# Patient Record
Sex: Female | Born: 1975
Health system: Southern US, Community
[De-identification: ages and names within clinical notes are randomized; demographics above are authoritative.]

## PROBLEM LIST (undated history)

## (undated) ENCOUNTER — Ambulatory Visit: Admission: EM | Payer: 59 | Source: Home / Self Care

## (undated) DIAGNOSIS — G4763 Sleep related bruxism: Secondary | ICD-10-CM

## (undated) DIAGNOSIS — Z9889 Other specified postprocedural states: Secondary | ICD-10-CM

## (undated) DIAGNOSIS — G8929 Other chronic pain: Secondary | ICD-10-CM

## (undated) DIAGNOSIS — R202 Paresthesia of skin: Secondary | ICD-10-CM

## (undated) DIAGNOSIS — R519 Headache, unspecified: Secondary | ICD-10-CM

## (undated) DIAGNOSIS — Z808 Family history of malignant neoplasm of other organs or systems: Secondary | ICD-10-CM

## (undated) DIAGNOSIS — R102 Pelvic and perineal pain unspecified side: Secondary | ICD-10-CM

## (undated) DIAGNOSIS — Z8049 Family history of malignant neoplasm of other genital organs: Secondary | ICD-10-CM

## (undated) DIAGNOSIS — D259 Leiomyoma of uterus, unspecified: Secondary | ICD-10-CM

## (undated) DIAGNOSIS — Z800 Family history of malignant neoplasm of digestive organs: Secondary | ICD-10-CM

## (undated) DIAGNOSIS — N920 Excessive and frequent menstruation with regular cycle: Secondary | ICD-10-CM

## (undated) DIAGNOSIS — R002 Palpitations: Secondary | ICD-10-CM

## (undated) DIAGNOSIS — K219 Gastro-esophageal reflux disease without esophagitis: Secondary | ICD-10-CM

## (undated) DIAGNOSIS — Z803 Family history of malignant neoplasm of breast: Secondary | ICD-10-CM

## (undated) DIAGNOSIS — R112 Nausea with vomiting, unspecified: Secondary | ICD-10-CM

## (undated) DIAGNOSIS — R51 Headache: Secondary | ICD-10-CM

## (undated) HISTORY — PX: WRIST GANGLION EXCISION: SUR520

## (undated) HISTORY — DX: Headache: R51

## (undated) HISTORY — DX: Family history of malignant neoplasm of other genital organs: Z80.49

## (undated) HISTORY — DX: Family history of malignant neoplasm of digestive organs: Z80.0

## (undated) HISTORY — DX: Family history of malignant neoplasm of breast: Z80.3

## (undated) HISTORY — DX: Headache, unspecified: R51.9

## (undated) HISTORY — DX: Family history of malignant neoplasm of other organs or systems: Z80.8

---
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
## 2004-07-14 HISTORY — PX: KNEE ARTHROSCOPY: SUR90

## 2012-04-06 ENCOUNTER — Other Ambulatory Visit: Payer: Self-pay | Admitting: Obstetrics and Gynecology

## 2012-04-06 DIAGNOSIS — N63 Unspecified lump in unspecified breast: Secondary | ICD-10-CM

## 2012-04-08 ENCOUNTER — Ambulatory Visit
Admission: RE | Admit: 2012-04-08 | Discharge: 2012-04-08 | Disposition: A | Discharge status: Home / Self Care | Payer: BC Managed Care – PPO | Source: Ambulatory Visit | Attending: Obstetrics and Gynecology | Admitting: Obstetrics and Gynecology

## 2012-04-08 DIAGNOSIS — N63 Unspecified lump in unspecified breast: Secondary | ICD-10-CM

## 2013-06-15 ENCOUNTER — Other Ambulatory Visit: Payer: Self-pay | Admitting: Obstetrics and Gynecology

## 2013-06-15 DIAGNOSIS — N6452 Nipple discharge: Secondary | ICD-10-CM

## 2013-06-15 DIAGNOSIS — N6320 Unspecified lump in the left breast, unspecified quadrant: Secondary | ICD-10-CM

## 2013-06-24 ENCOUNTER — Telehealth: Payer: Self-pay | Admitting: Family Medicine

## 2013-06-24 DIAGNOSIS — Z0000 Encounter for general adult medical examination without abnormal findings: Secondary | ICD-10-CM

## 2013-06-24 DIAGNOSIS — Z79899 Other long term (current) drug therapy: Secondary | ICD-10-CM

## 2013-06-24 DIAGNOSIS — D649 Anemia, unspecified: Secondary | ICD-10-CM

## 2013-06-24 NOTE — Telephone Encounter (Signed)
Pt requesting lab work.  Does she NTBS?  Wants cholesterol, iron, & ferritin and anything else you think may need to be checked.  Pt is a carrier of hemachromatosis. No lab work in 6 years and is 37 years old

## 2013-06-27 ENCOUNTER — Ambulatory Visit
Admission: RE | Admit: 2013-06-27 | Discharge: 2013-06-27 | Disposition: A | Discharge status: Home / Self Care | Payer: BC Managed Care – PPO | Source: Ambulatory Visit | Attending: Obstetrics and Gynecology | Admitting: Obstetrics and Gynecology

## 2013-06-27 DIAGNOSIS — N6452 Nipple discharge: Secondary | ICD-10-CM

## 2013-06-27 DIAGNOSIS — N6320 Unspecified lump in the left breast, unspecified quadrant: Secondary | ICD-10-CM

## 2013-06-30 NOTE — Telephone Encounter (Signed)
bloodwork orders ready. Pt notified to go over to lab 1 week before her appt. She made an appt for a med check.

## 2013-06-30 NOTE — Telephone Encounter (Signed)
Must be seen. Lip, liv m7 serum iron ferritin liver enzymes make appt.

## 2013-07-03 ENCOUNTER — Encounter: Payer: Self-pay | Admitting: *Deleted

## 2013-07-04 LAB — BASIC METABOLIC PANEL
CO2: 23 mEq/L (ref 19–32)
Calcium: 9.2 mg/dL (ref 8.4–10.5)
Chloride: 105 mEq/L (ref 96–112)
Glucose, Bld: 89 mg/dL (ref 70–99)
Potassium: 4.3 mEq/L (ref 3.5–5.3)
Sodium: 138 mEq/L (ref 135–145)

## 2013-07-04 LAB — LIPID PANEL
LDL Cholesterol: 94 mg/dL (ref 0–99)
Total CHOL/HDL Ratio: 5.3 Ratio
VLDL: 66 mg/dL — ABNORMAL HIGH (ref 0–40)

## 2013-07-04 LAB — HEPATIC FUNCTION PANEL
ALT: 22 U/L (ref 0–35)
AST: 17 U/L (ref 0–37)
Bilirubin, Direct: 0.1 mg/dL (ref 0.0–0.3)
Indirect Bilirubin: 0.3 mg/dL (ref 0.0–0.9)
Total Bilirubin: 0.4 mg/dL (ref 0.3–1.2)

## 2013-07-04 LAB — IRON: Iron: 86 ug/dL (ref 42–145)

## 2013-07-11 ENCOUNTER — Ambulatory Visit (INDEPENDENT_AMBULATORY_CARE_PROVIDER_SITE_OTHER): Payer: BC Managed Care – PPO | Admitting: Nurse Practitioner

## 2013-07-11 ENCOUNTER — Encounter: Payer: Self-pay | Admitting: Family Medicine

## 2013-07-11 ENCOUNTER — Encounter: Payer: Self-pay | Admitting: Nurse Practitioner

## 2013-07-11 VITALS — BP 110/80 | Temp 98.4°F | Ht 65.0 in | Wt 205.5 lb

## 2013-07-11 DIAGNOSIS — J209 Acute bronchitis, unspecified: Secondary | ICD-10-CM

## 2013-07-11 DIAGNOSIS — J0110 Acute frontal sinusitis, unspecified: Secondary | ICD-10-CM

## 2013-07-11 MED ORDER — AMOXICILLIN-POT CLAVULANATE 875-125 MG PO TABS: 1.0000 | ORAL_TABLET | Freq: Two times a day (BID) | ORAL | Status: DC

## 2013-07-11 MED ORDER — HYDROCODONE-HOMATROPINE 5-1.5 MG/5ML PO SYRP: 5.0000 mL | ORAL_SOLUTION | ORAL | Status: DC | PRN

## 2013-07-11 MED ORDER — ALBUTEROL SULFATE HFA 108 (90 BASE) MCG/ACT IN AERS: 2.0000 | INHALATION_SPRAY | RESPIRATORY_TRACT | Status: DC | PRN

## 2013-07-12 ENCOUNTER — Ambulatory Visit: Payer: Self-pay | Admitting: Family Medicine

## 2013-07-13 ENCOUNTER — Encounter: Payer: Self-pay | Admitting: Nurse Practitioner

## 2013-07-13 NOTE — Progress Notes (Signed)
Subjective:  Presents complaints of congestion and cough over the past month. Worse over the past week. Now having low-grade fever. Frontal area headache. Increase cough at night. Producing yellow mucus at times. Runny nose. Slight chest tightness with coughing. Slight wheeze yesterday. No vomiting diarrhea or abdominal pain. Slight sore throat. Some ear pain. Taking fluids well.  Objective:   BP 110/80  Temp(Src) 98.4 F (36.9 C) (Oral)  Ht 5\' 5"  (1.651 m)  Wt 205 lb 8 oz (93.214 kg)  BMI 34.20 kg/m2 NAD. Alert, oriented. TMs mild clear effusion, no erythema. Pharynx injected with PND noted. Neck supple with mild soft nontender adenopathy. Lungs scattered faint expiratory crackles, no wheezing or tachypnea. Heart regular rate rhythm. Frequent congested cough noted.  Assessment:Acute frontal sinusitis  Acute bronchitis  Plan: Meds ordered this encounter  Medications  . amoxicillin-clavulanate (AUGMENTIN) 875-125 MG per tablet    Sig: Take 1 tablet by mouth 2 (two) times daily.    Dispense:  20 tablet    Refill:  0    Order Specific Question:  Supervising Provider    Answer:  Merlyn Albert [[id number]]  . albuterol (PROVENTIL HFA;VENTOLIN HFA) 108 (90 BASE) MCG/ACT inhaler    Sig: Inhale 2 puffs into the lungs every 4 (four) hours as needed for wheezing or shortness of breath.    Dispense:  1 Inhaler    Refill:  0    Order Specific Question:  Supervising Provider    Answer:  Merlyn Albert [[id number]]  . HYDROcodone-homatropine (HYCODAN) 5-1.5 MG/5ML syrup    Sig: Take 5 mLs by mouth every 4 (four) hours as needed.    Dispense:  120 mL    Refill:  0    Order Specific Question:  Supervising Provider    Answer:  Merlyn Albert [[id number]]   OTC meds as directed for congestion. Warning signs reviewed. Call back by then the week if no improvement, sooner if worse.

## 2013-08-31 ENCOUNTER — Ambulatory Visit (INDEPENDENT_AMBULATORY_CARE_PROVIDER_SITE_OTHER): Payer: BC Managed Care – PPO | Admitting: Nurse Practitioner

## 2013-08-31 ENCOUNTER — Encounter: Payer: Self-pay | Admitting: Nurse Practitioner

## 2013-08-31 VITALS — BP 132/86 | Temp 98.9°F | Ht 65.0 in | Wt 207.0 lb

## 2013-08-31 DIAGNOSIS — J111 Influenza due to unidentified influenza virus with other respiratory manifestations: Secondary | ICD-10-CM

## 2013-08-31 DIAGNOSIS — J069 Acute upper respiratory infection, unspecified: Secondary | ICD-10-CM

## 2013-09-05 ENCOUNTER — Encounter: Payer: Self-pay | Admitting: Nurse Practitioner

## 2013-09-05 NOTE — Progress Notes (Signed)
Subjective:  Presents complaints of headache and cough that began less than 48 hours ago. No fever. Frequent cough producing yellow green mucus. Slight chest tightness with deep breath yesterday, slightly sore today. Has not used her inhaler. No wheezing. Slight sore throat. No ear pain. Some body aches. No vomiting diarrhea or abdominal pain. Fatigued. Taking fluids well. Voiding normal limit. Husband was diagnosed with influenza at our office earlier today.  Objective:   BP 132/86  Temp(Src) 98.9 F (37.2 C)  Ht 5\' 5"  (1.651 m)  Wt 207 lb (93.895 kg)  BMI 34.45 kg/m2 NAD. Alert, oriented. TMs clear effusion, no erythema. Pharynx erythematous with PND noted. Neck supple with mild soft anterior adenopathy. Lungs clear. Heart regular rate rhythm.  Assessment:Influenza  Acute upper respiratory infections of unspecified site  Plan: Had difficulty with prescriptions going through to the pharmacy. Nurse had to call in 2 prescriptions. Tamiflu 75 mg 1 by mouth twice a day x5 days. Levaquin 500 mg 1 by mouth daily x10 days. OTC meds as directed for congestion and cough. Callback in 5-7 days if no improvement, sooner if worse.  Plan:

## 2014-07-03 ENCOUNTER — Encounter: Payer: Self-pay | Admitting: Family Medicine

## 2014-07-03 ENCOUNTER — Ambulatory Visit (INDEPENDENT_AMBULATORY_CARE_PROVIDER_SITE_OTHER): Payer: BC Managed Care – PPO | Admitting: Family Medicine

## 2014-07-03 VITALS — BP 102/80 | Temp 98.6°F | Ht 65.0 in | Wt 208.4 lb

## 2014-07-03 DIAGNOSIS — J329 Chronic sinusitis, unspecified: Secondary | ICD-10-CM

## 2014-07-03 DIAGNOSIS — J310 Chronic rhinitis: Secondary | ICD-10-CM

## 2014-07-03 MED ORDER — AMOXICILLIN 500 MG PO CAPS: 500.0000 mg | ORAL_CAPSULE | Freq: Three times a day (TID) | ORAL | Status: DC

## 2014-07-03 NOTE — Progress Notes (Signed)
   Subjective:    Patient ID: Alicia Ross, female    DOB: [birth year], 38 y.o.   MRN: [id number]  Cough This is a new problem. The current episode started 1 to 4 weeks ago. The problem has been unchanged. The problem occurs constantly. The cough is productive of sputum. Associated symptoms include headaches, a sore throat and wheezing. Associated symptoms comments: Congestion, difficulty swallowing. Nothing aggravates the symptoms. Treatments tried: Mucinex. The treatment provided no relief.   Patient states that she has no other concerns at this time.   Started one and a half wks ago, started also with sorte throat  Headache and frontal   4 40 430 4 400    Review of Systems  HENT: Positive for sore throat.   Respiratory: Positive for cough and wheezing.   Neurological: Positive for headaches.       Objective:   Physical Exam Alert hydration good. Vitals reviewed. Moderate malaise. Frontal tenderness nasal discharge pharynx normal neck supple. Lungs clear. Heart regular in rhythm.       Assessment & Plan:  Impression acute rhinosinusitis plan antibiotics prescribed. Symptomatic care discussed. Warning signs discussed. WSL

## 2014-08-22 ENCOUNTER — Encounter (HOSPITAL_BASED_OUTPATIENT_CLINIC_OR_DEPARTMENT_OTHER): Payer: Self-pay | Admitting: *Deleted

## 2014-08-22 NOTE — Progress Notes (Signed)
NPO AFTER MN. ARRIVE AT 0600. NEEDS CBC AND URINE PREG.

## 2014-08-24 NOTE — H&P (Signed)
Alicia Ross is an 39 y.o. female presents for surgical sterilization.      Patient's last menstrual period was 07/23/2014 (exact date).    Past Medical History  Diagnosis Date  . Headache   . GERD (gastroesophageal reflux disease)     Past Surgical History  Procedure Laterality Date  . Cesarean section  01/ 2008  . Knee arthroscopy Left 2006  . Wrist ganglion excision Left 1994  & 2004    in 2004  also removed spurs  . Wisdom tooth extraction  1995    Family History  Problem Relation Age of Onset  . Cancer Other     breast  . Hyperlipidemia Other     Social History:  reports that she has never smoked. She has never used smokeless tobacco. She reports that she does not drink alcohol or use illicit drugs.  Allergies:  Allergies  Allergen Reactions  . Codeine Other (See Comments)    GI UPSET    No prescriptions prior to admission    ROS  Height 5\' 5"  (1.651 m), weight 200 lb (90.719 kg), last menstrual period 07/23/2014. Physical Exam  Gen - NAD Abd - soft, NT Ext - NT, no edema CV - RRR Lungs - clear   Assessment/Plan: Desires sterilization Laparoscopic tubal ligation  Jaylan Duggar 08/24/2014, 10:08 AM

## 2014-08-25 ENCOUNTER — Encounter (HOSPITAL_BASED_OUTPATIENT_CLINIC_OR_DEPARTMENT_OTHER): Payer: Self-pay | Admitting: *Deleted

## 2014-08-25 ENCOUNTER — Ambulatory Visit (HOSPITAL_BASED_OUTPATIENT_CLINIC_OR_DEPARTMENT_OTHER): Payer: BC Managed Care – PPO | Admitting: Anesthesiology

## 2014-08-25 ENCOUNTER — Ambulatory Visit (HOSPITAL_BASED_OUTPATIENT_CLINIC_OR_DEPARTMENT_OTHER)
Admission: RE | Admit: 2014-08-25 | Discharge: 2014-08-25 | Disposition: A | Discharge status: Home / Self Care | Payer: BC Managed Care – PPO | Source: Ambulatory Visit | Attending: Obstetrics and Gynecology | Admitting: Obstetrics and Gynecology

## 2014-08-25 ENCOUNTER — Encounter (HOSPITAL_BASED_OUTPATIENT_CLINIC_OR_DEPARTMENT_OTHER)
Admission: RE | Disposition: A | Discharge status: Home / Self Care | Payer: Self-pay | Source: Ambulatory Visit | Attending: Obstetrics and Gynecology

## 2014-08-25 DIAGNOSIS — Z302 Encounter for sterilization: Secondary | ICD-10-CM | POA: Insufficient documentation

## 2014-08-25 HISTORY — PX: LAPAROSCOPIC TUBAL LIGATION: SHX1937

## 2014-08-25 HISTORY — DX: Gastro-esophageal reflux disease without esophagitis: K21.9

## 2014-08-25 LAB — CBC
HEMATOCRIT: 41.9 % (ref 36.0–46.0)
Hemoglobin: 14.3 g/dL (ref 12.0–15.0)
MCH: 32 pg (ref 26.0–34.0)
MCHC: 34.1 g/dL (ref 30.0–36.0)
MCV: 93.7 fL (ref 78.0–100.0)
Platelets: 197 10*3/uL (ref 150–400)
RBC: 4.47 MIL/uL (ref 3.87–5.11)
RDW: 12.6 % (ref 11.5–15.5)
WBC: 7.4 10*3/uL (ref 4.0–10.5)

## 2014-08-25 LAB — POCT PREGNANCY, URINE: Preg Test, Ur: NEGATIVE

## 2014-08-25 SURGERY — LIGATION, FALLOPIAN TUBE, LAPAROSCOPIC
Anesthesia: General | Site: Abdomen | Laterality: Bilateral

## 2014-08-25 MED ORDER — PROPOFOL 10 MG/ML IV BOLUS
INTRAVENOUS | Status: DC | PRN
  Administered 2014-08-25: 200 mg via INTRAVENOUS

## 2014-08-25 MED ORDER — FENTANYL CITRATE 0.05 MG/ML IJ SOLN
25.0000 ug | INTRAMUSCULAR | Status: DC | PRN
Start: 2014-08-25 — End: 2014-08-25
  Administered 2014-08-25 (×2): 25 ug via INTRAVENOUS
  Filled 2014-08-25: qty 1

## 2014-08-25 MED ORDER — MIDAZOLAM HCL 2 MG/2ML IJ SOLN
INTRAMUSCULAR | Status: AC
  Filled 2014-08-25: qty 2

## 2014-08-25 MED ORDER — LACTATED RINGERS IV SOLN
INTRAVENOUS | Status: DC | PRN
  Administered 2014-08-25 (×2): via INTRAVENOUS

## 2014-08-25 MED ORDER — BUPIVACAINE HCL 0.25 % IJ SOLN
INTRAMUSCULAR | Status: DC | PRN
  Administered 2014-08-25: 4 mL

## 2014-08-25 MED ORDER — NEOSTIGMINE METHYLSULFATE 10 MG/10ML IV SOLN
INTRAVENOUS | Status: DC | PRN
  Administered 2014-08-25: 4 mg via INTRAVENOUS

## 2014-08-25 MED ORDER — 0.9 % SODIUM CHLORIDE (POUR BTL) OPTIME
TOPICAL | Status: DC | PRN
  Administered 2014-08-25: 500 mL

## 2014-08-25 MED ORDER — FENTANYL CITRATE 0.05 MG/ML IJ SOLN
INTRAMUSCULAR | Status: DC | PRN
  Administered 2014-08-25 (×2): 25 ug via INTRAVENOUS
  Administered 2014-08-25: 50 ug via INTRAVENOUS
  Administered 2014-08-25 (×6): 25 ug via INTRAVENOUS

## 2014-08-25 MED ORDER — LIDOCAINE HCL (CARDIAC) 20 MG/ML IV SOLN
INTRAVENOUS | Status: DC | PRN
  Administered 2014-08-25: 80 mg via INTRAVENOUS

## 2014-08-25 MED ORDER — PROMETHAZINE HCL 25 MG/ML IJ SOLN
INTRAMUSCULAR | Status: AC
  Filled 2014-08-25: qty 1

## 2014-08-25 MED ORDER — OXYCODONE-ACETAMINOPHEN 5-325 MG PO TABS
1.0000 | ORAL_TABLET | ORAL | Status: DC | PRN
Start: 2014-08-25 — End: 2015-05-04

## 2014-08-25 MED ORDER — PROMETHAZINE HCL 25 MG/ML IJ SOLN
6.2500 mg | INTRAMUSCULAR | Status: DC | PRN
  Administered 2014-08-25: 6.25 mg via INTRAVENOUS
  Filled 2014-08-25: qty 1

## 2014-08-25 MED ORDER — DEXTROSE 5 % IV SOLN
2.0000 g | INTRAVENOUS | Status: DC
  Filled 2014-08-25: qty 2

## 2014-08-25 MED ORDER — GLYCOPYRROLATE 0.2 MG/ML IJ SOLN
INTRAMUSCULAR | Status: DC | PRN
  Administered 2014-08-25: 0.6 mg via INTRAVENOUS

## 2014-08-25 MED ORDER — KETOROLAC TROMETHAMINE 30 MG/ML IJ SOLN
INTRAMUSCULAR | Status: DC | PRN
  Administered 2014-08-25: 30 mg via INTRAVENOUS

## 2014-08-25 MED ORDER — MIDAZOLAM HCL 5 MG/5ML IJ SOLN
INTRAMUSCULAR | Status: DC | PRN
  Administered 2014-08-25 (×2): 1 mg via INTRAVENOUS

## 2014-08-25 MED ORDER — ACETAMINOPHEN 10 MG/ML IV SOLN
INTRAVENOUS | Status: DC | PRN
  Administered 2014-08-25: 1000 mg via INTRAVENOUS

## 2014-08-25 MED ORDER — OXYCODONE HCL 5 MG PO TABS
ORAL_TABLET | ORAL | Status: AC
Start: 2014-08-25 — End: 2014-08-25
  Filled 2014-08-25: qty 1

## 2014-08-25 MED ORDER — FENTANYL CITRATE 0.05 MG/ML IJ SOLN
INTRAMUSCULAR | Status: AC
  Filled 2014-08-25: qty 2

## 2014-08-25 MED ORDER — LACTATED RINGERS IV SOLN
INTRAVENOUS | Status: DC
  Administered 2014-08-25: 09:00:00 via INTRAVENOUS
  Filled 2014-08-25: qty 1000

## 2014-08-25 MED ORDER — DEXAMETHASONE SODIUM PHOSPHATE 4 MG/ML IJ SOLN
INTRAMUSCULAR | Status: DC | PRN
  Administered 2014-08-25: 10 mg via INTRAVENOUS

## 2014-08-25 MED ORDER — OXYCODONE HCL 5 MG PO TABS
5.0000 mg | ORAL_TABLET | Freq: Once | ORAL | Status: AC
  Administered 2014-08-25: 5 mg via ORAL
  Filled 2014-08-25: qty 1

## 2014-08-25 MED ORDER — MEPERIDINE HCL 25 MG/ML IJ SOLN
6.2500 mg | INTRAMUSCULAR | Status: DC | PRN
  Filled 2014-08-25: qty 1

## 2014-08-25 MED ORDER — ROCURONIUM BROMIDE 100 MG/10ML IV SOLN
INTRAVENOUS | Status: DC | PRN
  Administered 2014-08-25: 30 mg via INTRAVENOUS

## 2014-08-25 MED ORDER — LACTATED RINGERS IV SOLN
INTRAVENOUS | Status: DC
  Administered 2014-08-25: 07:00:00 via INTRAVENOUS
  Filled 2014-08-25: qty 1000

## 2014-08-25 MED ORDER — FENTANYL CITRATE 0.05 MG/ML IJ SOLN
INTRAMUSCULAR | Status: AC
  Filled 2014-08-25: qty 6

## 2014-08-25 MED ORDER — DEXTROSE 5 % IV SOLN
2.0000 g | INTRAVENOUS | Status: AC
  Administered 2014-08-25: 2 g via INTRAVENOUS
  Filled 2014-08-25: qty 2

## 2014-08-25 MED ORDER — SUCCINYLCHOLINE CHLORIDE 20 MG/ML IJ SOLN
INTRAMUSCULAR | Status: DC | PRN
  Administered 2014-08-25: 100 mg via INTRAVENOUS

## 2014-08-25 MED ORDER — ONDANSETRON HCL 4 MG/2ML IJ SOLN
INTRAMUSCULAR | Status: DC | PRN
  Administered 2014-08-25: 4 mg via INTRAVENOUS

## 2014-08-25 MED ORDER — CEFOTETAN DISODIUM-DEXTROSE 2-2.08 GM-% IV SOLR
INTRAVENOUS | Status: AC
  Filled 2014-08-25: qty 50

## 2014-08-25 SURGICAL SUPPLY — 59 items
APPLICATOR COTTON TIP 6IN STRL (MISCELLANEOUS) ×2 IMPLANT
BAG URINE DRAINAGE (UROLOGICAL SUPPLIES) IMPLANT
BENZOIN TINCTURE PRP APPL 2/3 (GAUZE/BANDAGES/DRESSINGS) IMPLANT
BLADE CLIPPER SURG (BLADE) IMPLANT
BLADE SURG 11 STRL SS (BLADE) ×2 IMPLANT
CANISTER SUCTION 1200CC (MISCELLANEOUS) IMPLANT
CANISTER SUCTION 2500CC (MISCELLANEOUS) IMPLANT
CATH FOLEY 2WAY SLVR  5CC 16FR (CATHETERS)
CATH FOLEY 2WAY SLVR 5CC 16FR (CATHETERS) IMPLANT
CATH ROBINSON RED A/P 16FR (CATHETERS) ×2 IMPLANT
CHLORAPREP W/TINT 26ML (MISCELLANEOUS) ×2 IMPLANT
CLIP FILSHIE TUBAL LIGA STRL (Clip) ×2 IMPLANT
COVER MAYO STAND STRL (DRAPES) ×2 IMPLANT
DRAPE CAMERA CLOSED 9X96 (DRAPES) IMPLANT
DRAPE UNDERBUTTOCKS STRL (DRAPE) ×2 IMPLANT
ELECT REM PT RETURN 9FT ADLT (ELECTROSURGICAL) ×2
ELECTRODE REM PT RTRN 9FT ADLT (ELECTROSURGICAL) ×1 IMPLANT
GLOVE BIO SURGEON STRL SZ 6.5 (GLOVE) ×4 IMPLANT
GLOVE BIOGEL PI IND STRL 6.5 (GLOVE) ×1 IMPLANT
GLOVE BIOGEL PI IND STRL 7.0 (GLOVE) ×1 IMPLANT
GLOVE BIOGEL PI IND STRL 7.5 (GLOVE) ×1 IMPLANT
GLOVE BIOGEL PI INDICATOR 6.5 (GLOVE) ×1
GLOVE BIOGEL PI INDICATOR 7.0 (GLOVE) ×1
GLOVE BIOGEL PI INDICATOR 7.5 (GLOVE) ×1
GLOVE INDICATOR 7.0 STRL GRN (GLOVE) ×2 IMPLANT
GLOVE SURG SS PI 6.0 STRL IVOR (GLOVE) ×2 IMPLANT
GLOVE SURG SS PI 7.0 STRL IVOR (GLOVE) ×2 IMPLANT
GOWN STRL REUS W/ TWL LRG LVL3 (GOWN DISPOSABLE) ×3 IMPLANT
GOWN STRL REUS W/TWL LRG LVL3 (GOWN DISPOSABLE) ×3
LEGGING LITHOTOMY PAIR STRL (DRAPES) ×2 IMPLANT
LIQUID BAND (GAUZE/BANDAGES/DRESSINGS) ×2 IMPLANT
NEEDLE HYPO 25X1 1.5 SAFETY (NEEDLE) ×2 IMPLANT
NEEDLE INSUFFLATION 14GA 120MM (NEEDLE) IMPLANT
NEEDLE INSUFFLATION 14GA 150MM (NEEDLE) IMPLANT
NS IRRIG 500ML POUR BTL (IV SOLUTION) ×2 IMPLANT
PACK BASIN DAY SURGERY FS (CUSTOM PROCEDURE TRAY) ×2 IMPLANT
PACK LAPAROSCOPY II (CUSTOM PROCEDURE TRAY) ×2 IMPLANT
PAD OB MATERNITY 4.3X12.25 (PERSONAL CARE ITEMS) ×2 IMPLANT
PAD PREP 24X48 CUFFED NSTRL (MISCELLANEOUS) ×2 IMPLANT
PENCIL BUTTON HOLSTER BLD 10FT (ELECTRODE) IMPLANT
POUCH SPECIMEN RETRIEVAL 10MM (ENDOMECHANICALS) IMPLANT
SCALPEL HARMONIC ACE (MISCELLANEOUS) IMPLANT
SET IRRIG TUBING LAPAROSCOPIC (IRRIGATION / IRRIGATOR) IMPLANT
SOLUTION ANTI FOG 6CC (MISCELLANEOUS) ×2 IMPLANT
STRIP CLOSURE SKIN 1/2X4 (GAUZE/BANDAGES/DRESSINGS) IMPLANT
SUT VIC AB 3-0 PS2 18 (SUTURE) ×1
SUT VIC AB 3-0 PS2 18XBRD (SUTURE) ×1 IMPLANT
SUT VICRYL 0 UR6 27IN ABS (SUTURE) ×2 IMPLANT
SYR 3ML 23GX1 SAFETY (SYRINGE) IMPLANT
SYR CONTROL 10ML LL (SYRINGE) ×2 IMPLANT
SYRINGE 10CC LL (SYRINGE) IMPLANT
TOWEL OR 17X24 6PK STRL BLUE (TOWEL DISPOSABLE) ×4 IMPLANT
TRAY DSU PREP LF (CUSTOM PROCEDURE TRAY) ×2 IMPLANT
TROCAR OPTI TIP 5M 100M (ENDOMECHANICALS) IMPLANT
TROCAR XCEL BLUNT TIP 100MML (ENDOMECHANICALS) IMPLANT
TROCAR XCEL NON-BLD 11X100MML (ENDOMECHANICALS) ×2 IMPLANT
TUBING INSUFFLATION 10FT LAP (TUBING) ×2 IMPLANT
TUBING INSUFFLATION W/FILTER (TUBING) ×2 IMPLANT
WATER STERILE IRR 500ML POUR (IV SOLUTION) ×2 IMPLANT

## 2014-08-25 NOTE — Anesthesia Preprocedure Evaluation (Addendum)
Anesthesia Evaluation  Patient identified by MRN, date of birth, ID band Patient awake    Reviewed: Allergy & Precautions, NPO status , Patient's Chart, lab work & pertinent test results  Airway Mallampati: II  TM Distance: >3 FB Neck ROM: Full    Dental no notable dental hx.    Pulmonary neg pulmonary ROS,  breath sounds clear to auscultation  Pulmonary exam normal       Cardiovascular negative cardio ROS  Rhythm:Regular Rate:Normal     Neuro/Psych negative neurological ROS  negative psych ROS   GI/Hepatic Neg liver ROS, GERD-  Controlled,  Endo/Other  negative endocrine ROS  Renal/GU negative Renal ROS  negative genitourinary   Musculoskeletal negative musculoskeletal ROS (+)   Abdominal   Peds negative pediatric ROS (+)  Hematology negative hematology ROS (+)   Anesthesia Other Findings   Reproductive/Obstetrics negative OB ROS                            Anesthesia Physical Anesthesia Plan  ASA: II  Anesthesia Plan: General   Post-op Pain Management:    Induction: Intravenous  Airway Management Planned: Oral ETT  Additional Equipment:   Intra-op Plan:   Post-operative Plan: Extubation in OR  Informed Consent: I have reviewed the patients History and Physical, chart, labs and discussed the procedure including the risks, benefits and alternatives for the proposed anesthesia with the patient or authorized representative who has indicated his/her understanding and acceptance.   Dental advisory given  Plan Discussed with: CRNA  Anesthesia Plan Comments:         Anesthesia Quick Evaluation

## 2014-08-25 NOTE — Discharge Instructions (Signed)
Laparoscopic Tubal Ligation Care After Refer to this sheet in the next few weeks. These instructions provide you with information on caring for yourself after your procedure. Your caregiver may also give you more specific instructions. Your treatment has been planned according to current medical practices, but problems sometimes occur. Call your caregiver if you have any problems or questions after your procedure. HOME CARE INSTRUCTIONS   Rest the remainder of the day.  Only take over-the-counter or prescription medicines for pain, discomfort, or fever as directed by your caregiver. Do not take aspirin. It can cause bleeding.  Gradually resume daily activities, diet, rest, driving, and work.  Avoid sexual intercourse for 2 weeks or as directed.  Do not use tampons or douche.  Do not drive while taking pain medicine.  Do not lift anything over 5 pounds for 2 weeks or as directed.  Only take showers, not baths, until you are seen by your caregiver.  Change bandages (dressings) as directed.  Take your temperature twice a day and record it.  Try to have help for the first 7 to 10 days for your household needs.  Return to your caregiver to get your stitches (sutures) removed and for follow-up visits as directed. SEEK MEDICAL CARE IF:   You have redness, swelling, or increasing pain in a wound.  You have drainage from a wound lasting longer than 1 day.  Your pain is getting worse.  You have a rash.  You become dizzy or lightheaded.  You have a reaction to your medicine.  You need stronger medicine or a change in your pain medicine.  You notice a bad smell coming from a wound or dressing.  Your wound breaks open after the sutures have been removed.  You are constipated. SEEK IMMEDIATE MEDICAL CARE IF:   You faint.  You have a fever.  You have increasing abdominal pain.  You have severe pain in your shoulders.  You have bleeding or drainage from the suture sites or  vagina following surgery.  You have shortness of breath or difficulty breathing.  You have chest or leg pain.  You have persistent nausea, vomiting, or diarrhea. MAKE SURE YOU:   Understand these instructions.  Watch your condition.  Get help right away if you are not doing well or get worse. Document Released: 01/17/2005 Document Revised: 12/30/2011 Document Reviewed: 10/11/2011 [facility] Patient Information 2015 [place], [place]. This information is not intended to replace advice given to you by your health care provider. Make sure you discuss any questions you have with your health care provider.  Post Anesthesia Home Care Instructions  Activity: Get plenty of rest for the remainder of the day. A responsible adult should stay with you for 24 hours following the procedure.  For the next 24 hours, DO NOT: -Drive a car -Paediatric nurse -Drink alcoholic beverages -Take any medication unless instructed by your physician -Make any legal decisions or sign important papers.  Meals: Start with liquid foods such as gelatin or soup. Progress to regular foods as tolerated. Avoid greasy, spicy, heavy foods. If nausea and/or vomiting occur, drink only clear liquids until the nausea and/or vomiting subsides. Call your physician if vomiting continues.  Special Instructions/Symptoms: Your throat may feel dry or sore from the anesthesia or the breathing tube placed in your throat during surgery. If this causes discomfort, gargle with warm salt water. The discomfort should disappear within 24 hours.

## 2014-08-25 NOTE — Anesthesia Postprocedure Evaluation (Signed)
  Anesthesia Post-op Note  Patient: Alicia Ross  Procedure(s) Performed: Procedure(s) (LRB): LAPAROSCOPIC TUBAL LIGATION with Filshie Clips (Bilateral)  Patient Location: PACU  Anesthesia Type: General  Level of Consciousness: awake and alert   Airway and Oxygen Therapy: Patient Spontanous Breathing  Post-op Pain: mild  Post-op Assessment: Post-op Vital signs reviewed, Patient's Cardiovascular Status Stable, Respiratory Function Stable, Patent Airway and No signs of Nausea or vomiting  Last Vitals:  Filed Vitals:   08/25/14 1100  BP: 128/88  Pulse: 55  Temp: 36.5 C  Resp: 16    Post-op Vital Signs: stable   Complications: No apparent anesthesia complications

## 2014-08-25 NOTE — Anesthesia Procedure Notes (Signed)
Procedure Name: Intubation Date/Time: 08/25/2014 7:32 AM Performed by: Justice Rocher Pre-anesthesia Checklist: Patient identified, Emergency Drugs available, Suction available and Patient being monitored Patient Re-evaluated:Patient Re-evaluated prior to inductionOxygen Delivery Method: Circle System Utilized Preoxygenation: Pre-oxygenation with 100% oxygen Intubation Type: IV induction Ventilation: Mask ventilation without difficulty Laryngoscope Size: Mac and 3 Grade View: Grade II Tube type: Oral Tube size: 7.0 mm Number of attempts: 1 Airway Equipment and Method: Stylet and Oral airway Placement Confirmation: ETT inserted through vocal cords under direct vision,  positive ETCO2 and breath sounds checked- equal and bilateral Secured at: 19 cm Tube secured with: Tape Dental Injury: Teeth and Oropharynx as per pre-operative assessment

## 2014-08-25 NOTE — Progress Notes (Signed)
H&P reviewed, no changes R/b/a discussed, questions answered, informed consent

## 2014-08-25 NOTE — Transfer of Care (Signed)
Immediate Anesthesia Transfer of Care Note  Patient: Alicia Ross  Procedure(s) Performed: Procedure(s) (LRB): LAPAROSCOPIC TUBAL LIGATION with Filshie Clips (Bilateral)  Patient Location: PACU  Anesthesia Type: General  Level of Consciousness: awake, sedated, patient cooperative and responds to stimulation  Airway & Oxygen Therapy: Patient Spontanous Breathing and Patient connected to face mask oxygen  Post-op Assessment: Report given to PACU RN, Post -op Vital signs reviewed and stable and Patient moving all extremities  Post vital signs: Reviewed and stable  Complications: No apparent anesthesia complications

## 2014-08-26 NOTE — Op Note (Signed)
NAME:  Alicia Ross, Alicia Ross NO.:  [id number]  MEDICAL RECORD NO.:  [id number]  LOCATION:                                 FACILITY:  PHYSICIAN:  [name], MD         DATE OF BIRTH:  DATE OF PROCEDURE:  08/25/2014 DATE OF DISCHARGE:                              OPERATIVE REPORT   PREOPERATIVE DIAGNOSIS:  Desires sterilization.  POSTOPERATIVE DIAGNOSIS:  Desires sterilization.  SURGEON:  [name], MD  PROCEDURE:  Laparoscopic tubal ligation with Filshie clips.  ANESTHESIA:  General.  COMPLICATIONS:  None.  SPECIMENS:  None.  CONDITION:  Stable to recovery room.  DESCRIPTION OF PROCEDURE:  The patient was taken to the operating room. After anesthesia was obtained, she was placed in the dorsal lithotomy position using Allen stirrups.  Prepped and draped in sterile fashion. An in and out catheter was used to drain her bladder.  Bivalve speculum was placed in the vagina and a single-tooth tenaculum attached to the anterior lip of the cervix.  Hulka clamp was placed.  Tenaculum and speculum were removed and our attention was turned to the abdomen.  An infraumbilical skin incision was made with a scalpel and extended bluntly to the level of the fascia using a Kelly clamp.  Optical trocar was inserted under direct visualization.  Once intraperitoneal placement was confirmed, CO2 was turned on and the abdomen and pelvis were insufflated.  Bilateral fallopian tubes were visualized and appeared normal.  Filshie clips were placed on each tube.  Good blanching and placement was noted.  All instruments and trocars were then removed from the abdomen.  A deep stitch was placed in the infraumbilical incision. The skin was closed with Vicryl and Dermabond was placed over the skin. Sponge, lap, needle and instrument counts were correct x2.  She tolerated the procedure well.    [name], MD    GA/MEDQ  D:  08/25/2014  T:  08/26/2014  Job:  [id number]

## 2014-08-28 ENCOUNTER — Encounter (HOSPITAL_BASED_OUTPATIENT_CLINIC_OR_DEPARTMENT_OTHER): Payer: Self-pay | Admitting: Obstetrics and Gynecology

## 2015-05-04 ENCOUNTER — Encounter: Payer: Self-pay | Admitting: Family Medicine

## 2015-05-04 ENCOUNTER — Ambulatory Visit (INDEPENDENT_AMBULATORY_CARE_PROVIDER_SITE_OTHER): Payer: 59 | Admitting: Family Medicine

## 2015-05-04 VITALS — BP 118/82 | Temp 98.4°F | Ht 65.0 in | Wt 209.2 lb

## 2015-05-04 DIAGNOSIS — J329 Chronic sinusitis, unspecified: Secondary | ICD-10-CM

## 2015-05-04 DIAGNOSIS — J310 Chronic rhinitis: Secondary | ICD-10-CM

## 2015-05-04 MED ORDER — AMOXICILLIN 500 MG PO CAPS: 500.0000 mg | ORAL_CAPSULE | Freq: Three times a day (TID) | ORAL | Status: DC

## 2015-05-04 NOTE — Progress Notes (Signed)
   Subjective:    Patient ID: Alicia Ross, female    DOB: May 16, 1976, 39 y.o.   MRN: [id number]  Cough This is a new problem. The current episode started in the past 7 days. Associated symptoms include ear pain, headaches, nasal congestion and a sore throat. Treatments tried: claritin d and mucinex.   Others in family sick also  No sig fever  Positional pressure  Slightly off with equil  Cough not bad, di enrgy  Throat sore but not bad  Some sinus in the norning  Last fir or so,   Some yello Review of Systems  HENT: Positive for ear pain and sore throat.   Respiratory: Positive for cough.   Neurological: Positive for headaches.       Objective:   Physical Exam Alert moderate malaise. Vitals stable H&T frontal tenderness max or tenderness pharynx normal neck supple lungs clear heart regular in rhythm.       Assessment & Plan:  Impression post viral rhinosinusitis plan antibiotics prescribed. Symptom care discussed warning signs discussed WSL

## 2015-07-26 ENCOUNTER — Encounter: Payer: Self-pay | Admitting: Family Medicine

## 2015-07-26 ENCOUNTER — Ambulatory Visit (INDEPENDENT_AMBULATORY_CARE_PROVIDER_SITE_OTHER): Payer: 59 | Admitting: Family Medicine

## 2015-07-26 VITALS — BP 120/80 | Temp 98.3°F | Ht 65.0 in | Wt 208.4 lb

## 2015-07-26 DIAGNOSIS — J310 Chronic rhinitis: Secondary | ICD-10-CM

## 2015-07-26 DIAGNOSIS — J329 Chronic sinusitis, unspecified: Secondary | ICD-10-CM

## 2015-07-26 MED ORDER — LEVOFLOXACIN 500 MG PO TABS: 500.0000 mg | ORAL_TABLET | Freq: Every day | ORAL | Status: DC

## 2015-07-26 NOTE — Progress Notes (Signed)
   Subjective:    Patient ID: Alicia Ross, female    DOB: January 01, 1976, 40 y.o.   MRN: [id number]  Sinusitis This is a new problem. The current episode started 1 to 4 weeks ago. The problem is unchanged. There has been no fever. The pain is moderate. Associated symptoms include congestion, coughing, ear pain, headaches and a sore throat. Pertinent negatives include no shortness of breath. Past treatments include oral decongestants. The treatment provided no relief.   Patient has no other concerns at this time.    Review of Systems  Constitutional: Negative for fever and activity change.  HENT: Positive for congestion, ear pain, rhinorrhea and sore throat.   Eyes: Negative for discharge.  Respiratory: Positive for cough. Negative for shortness of breath and wheezing.   Cardiovascular: Negative for chest pain.  Neurological: Positive for headaches.       Objective:   Physical Exam  Constitutional: She appears well-developed.  HENT:  Head: Normocephalic.  Nose: Nose normal.  Mouth/Throat: Oropharynx is clear and moist. No oropharyngeal exudate.  Neck: Neck supple.  Cardiovascular: Normal rate and normal heart sounds.   No murmur heard. Pulmonary/Chest: Effort normal and breath sounds normal. She has no wheezes.  Lymphadenopathy:    She has no cervical adenopathy.  Skin: Skin is warm and dry.  Nursing note and vitals reviewed.         Assessment & Plan:  Patient was seen today for upper respiratory illness. It is felt that the patient is dealing with sinusitis. Antibiotics were prescribed today. Importance of compliance with medication was discussed. Symptoms should gradually resolve over the course of the next several days. If high fevers, progressive illness, difficulty breathing, worsening condition or failure for symptoms to improve over the next several days then the patient is to follow-up. If any emergent conditions the patient is to follow-up in the emergency department  otherwise to follow-up in the office.

## 2015-08-17 ENCOUNTER — Other Ambulatory Visit: Payer: Self-pay | Admitting: Obstetrics and Gynecology

## 2015-08-17 ENCOUNTER — Other Ambulatory Visit: Payer: Self-pay

## 2015-08-17 DIAGNOSIS — Z809 Family history of malignant neoplasm, unspecified: Secondary | ICD-10-CM

## 2015-08-24 ENCOUNTER — Ambulatory Visit
Admission: RE | Admit: 2015-08-24 | Discharge: 2015-08-24 | Disposition: A | Discharge status: Home / Self Care | Payer: PRIVATE HEALTH INSURANCE | Source: Ambulatory Visit | Attending: Obstetrics and Gynecology | Admitting: Obstetrics and Gynecology

## 2015-08-24 DIAGNOSIS — Z809 Family history of malignant neoplasm, unspecified: Secondary | ICD-10-CM

## 2015-08-24 MED ORDER — GADOBENATE DIMEGLUMINE 529 MG/ML IV SOLN
18.0000 mL | Freq: Once | INTRAVENOUS | Status: AC | PRN
  Administered 2015-08-24: 18 mL via INTRAVENOUS

## 2015-08-30 ENCOUNTER — Ambulatory Visit (INDEPENDENT_AMBULATORY_CARE_PROVIDER_SITE_OTHER): Payer: 59 | Admitting: Family Medicine

## 2015-08-30 ENCOUNTER — Encounter: Payer: Self-pay | Admitting: Family Medicine

## 2015-08-30 VITALS — BP 118/74 | Temp 99.3°F | Ht 65.0 in | Wt 205.0 lb

## 2015-08-30 DIAGNOSIS — J310 Chronic rhinitis: Secondary | ICD-10-CM

## 2015-08-30 DIAGNOSIS — J329 Chronic sinusitis, unspecified: Secondary | ICD-10-CM

## 2015-08-30 MED ORDER — CEFDINIR 300 MG PO CAPS: 300.0000 mg | ORAL_CAPSULE | Freq: Two times a day (BID) | ORAL | Status: DC

## 2015-08-30 MED ORDER — BENZONATATE 100 MG PO CAPS: 100.0000 mg | ORAL_CAPSULE | Freq: Four times a day (QID) | ORAL | Status: DC | PRN

## 2015-08-30 NOTE — Progress Notes (Signed)
   Subjective:    Patient ID: Alicia Ross, female    DOB: Oct 13, 1975, 40 y.o.   MRN: [id number]  Cough This is a new problem. The current episode started 1 to 4 weeks ago. Associated symptoms include ear pain, a fever, headaches, nasal congestion, a sore throat and wheezing. Treatments tried: claritin d and motrin.   Sick in [place] with sinus and bronchial irritation never copletely wenty away  tue coughing bad and sore throat   Some headache plus cong and dranage  Bad cough tightness in the chest  Ear pain last night  Diffuse hedche   Review of Systems  Constitutional: Positive for fever.  HENT: Positive for ear pain and sore throat.   Respiratory: Positive for cough and wheezing.   Neurological: Positive for headaches.       Objective:   Physical Exam  Alert, mild malaise. Hydration good Vitals stable. frontal/ maxillary tenderness evident positive nasal congestion. pharynx normal neck supple  lungs clear/no crackles or wheezes. heart regular in rhythm       Assessment & Plan:  Impression rhinosinusitis likely post viral, discussed with patient. plan antibiotics prescribed. Questions answered. Symptomatic care discussed. warning signs discussed. WSL

## 2016-03-07 ENCOUNTER — Ambulatory Visit (INDEPENDENT_AMBULATORY_CARE_PROVIDER_SITE_OTHER): Payer: 59 | Admitting: Nurse Practitioner

## 2016-03-07 ENCOUNTER — Encounter: Payer: Self-pay | Admitting: Nurse Practitioner

## 2016-03-07 VITALS — BP 122/84 | Temp 98.1°F | Ht 65.0 in | Wt 211.0 lb

## 2016-03-07 DIAGNOSIS — J329 Chronic sinusitis, unspecified: Secondary | ICD-10-CM | POA: Diagnosis not present

## 2016-03-07 DIAGNOSIS — J310 Chronic rhinitis: Secondary | ICD-10-CM

## 2016-03-07 MED ORDER — METHYLPREDNISOLONE ACETATE 40 MG/ML IJ SUSP
40.0000 mg | Freq: Once | INTRAMUSCULAR | Status: AC
  Administered 2016-03-07: 40 mg via INTRAMUSCULAR

## 2016-03-07 MED ORDER — CEFDINIR 300 MG PO CAPS: 300.0000 mg | ORAL_CAPSULE | Freq: Two times a day (BID) | ORAL | 0 refills | Status: DC

## 2016-03-07 NOTE — Patient Instructions (Signed)
Steroid nasal spray (Nasacort AQ)

## 2016-03-08 ENCOUNTER — Encounter: Payer: Self-pay | Admitting: Nurse Practitioner

## 2016-03-08 NOTE — Progress Notes (Signed)
Subjective:  Presents for complaints of head congestion frontal area pressure and headache and mild dizziness for the past week and a half. Low-grade fever. Sore throat for the past 3 days. Occasional cough, now producing green mucus. Bilateral ear pain and pressure. No wheezing but felt slightly tight with prolonged coughing last night. Has an albuterol inhaler if needed. No vomiting diarrhea or abdominal pain.  Objective:   BP 122/84   Temp 98.1 F (36.7 C) (Oral)   Ht 5\' 5"  (1.651 m)   Wt 211 lb (95.7 kg)   BMI 35.11 kg/m  NAD. Alert, oriented. TMs significant clear effusion, no erythema. Pharynx injected with green PND noted. Neck supple with mild soft nontender adenopathy. Lungs clear. Heart regular rate rhythm.  Assessment: Rhinosinusitis - Plan: methylPREDNISolone acetate (DEPO-MEDROL) injection 40 mg  Plan:  Meds ordered this encounter  Medications  . cefdinir (OMNICEF) 300 MG capsule    Sig: Take 1 capsule (300 mg total) by mouth 2 (two) times daily.    Dispense:  20 capsule    Refill:  0    Order Specific Question:   Supervising Provider    Answer:   Mikey Kirschner [[id number]]  . methylPREDNISolone acetate (DEPO-MEDROL) injection 40 mg   Restart OTC antihistamine. Add steroid nasal sprays directed. Callback in 7-10 days if no improvement, sooner if worse.

## 2016-06-24 ENCOUNTER — Encounter: Payer: Self-pay | Admitting: Family Medicine

## 2016-06-24 ENCOUNTER — Ambulatory Visit (INDEPENDENT_AMBULATORY_CARE_PROVIDER_SITE_OTHER): Payer: 59 | Admitting: Family Medicine

## 2016-06-24 VITALS — Temp 98.2°F | Ht 65.0 in | Wt 210.0 lb

## 2016-06-24 DIAGNOSIS — H6501 Acute serous otitis media, right ear: Secondary | ICD-10-CM

## 2016-06-24 DIAGNOSIS — J329 Chronic sinusitis, unspecified: Secondary | ICD-10-CM

## 2016-06-24 DIAGNOSIS — J310 Chronic rhinitis: Secondary | ICD-10-CM

## 2016-06-24 MED ORDER — AMOXICILLIN 500 MG PO CAPS: 500.0000 mg | ORAL_CAPSULE | Freq: Three times a day (TID) | ORAL | 0 refills | Status: DC

## 2016-06-24 NOTE — Progress Notes (Signed)
   Subjective:    Patient ID: Alicia Ross, female    DOB: 15-Apr-1976, 40 y.o.   MRN: [id number]  Cough  This is a new problem. The current episode started 1 to 4 weeks ago. Associated symptoms include ear pain, headaches, nasal congestion and a sore throat. Treatments tried: claritin d.    One and a half wk with a slight tickle   Now ongoing   sonme cough  No prod uctive  No fever  Some sl achiness  More sore in the whenever     Frontal sinus hea . Worse with positio   Review of Systems  HENT: Positive for ear pain and sore throat.   Respiratory: Positive for cough.   Neurological: Positive for headaches.       Objective:   Physical Exam  Alert, mild malaise. Hydration good Vitals stable. frontal/ maxillary tenderness evident positive nasal congestion. pharynx normal neck supple  lungs clear/no crackles or wheezes. heart regular in rhythm Positive right otitis media      Assessment & Plan:  Impression rhinosinusitis lPlus right otitis media ikely post viral, discussed with patient. plan antibiotics prescribed. Questions answered. Symptomatic care discussed. warning signs discussed. WSL

## 2016-07-03 ENCOUNTER — Telehealth: Payer: Self-pay | Admitting: Family Medicine

## 2016-07-03 MED ORDER — CLARITHROMYCIN 500 MG PO TABS: 500.0000 mg | ORAL_TABLET | Freq: Two times a day (BID) | ORAL | 0 refills | Status: DC

## 2016-07-03 NOTE — Telephone Encounter (Signed)
Patient seen on 06/24/16 by Dr. Richardson Landry for rhinosinusitis and prescribed amoxicillin.  She feels a little better, but she is still having a cough, chest congestion with ear drainage and sinus pressure.  She wants to know if something else can be prescribed?  Patient said it is okay to leave detailed message on voicemail if need be.  [place]

## 2016-07-03 NOTE — Telephone Encounter (Signed)
Prescription sent electronically to pharmacy. Patient notified. 

## 2016-07-03 NOTE — Telephone Encounter (Signed)
biaxin 500 bid ten d 

## 2016-08-08 DIAGNOSIS — Z01419 Encounter for gynecological examination (general) (routine) without abnormal findings: Secondary | ICD-10-CM | POA: Diagnosis not present

## 2017-01-08 ENCOUNTER — Ambulatory Visit (INDEPENDENT_AMBULATORY_CARE_PROVIDER_SITE_OTHER): Payer: 59 | Admitting: Family Medicine

## 2017-01-08 ENCOUNTER — Encounter: Payer: Self-pay | Admitting: Family Medicine

## 2017-01-08 VITALS — BP 120/90 | Temp 98.7°F | Ht 65.0 in | Wt 215.0 lb

## 2017-01-08 DIAGNOSIS — Z8349 Family history of other endocrine, nutritional and metabolic diseases: Secondary | ICD-10-CM

## 2017-01-08 DIAGNOSIS — Z13220 Encounter for screening for lipoid disorders: Secondary | ICD-10-CM | POA: Diagnosis not present

## 2017-01-08 DIAGNOSIS — Z79899 Other long term (current) drug therapy: Secondary | ICD-10-CM | POA: Diagnosis not present

## 2017-01-08 DIAGNOSIS — R252 Cramp and spasm: Secondary | ICD-10-CM

## 2017-01-08 NOTE — Progress Notes (Signed)
   Subjective:    Patient ID: Alicia Ross, female    DOB: [birth year], 41 y.o.   MRN: [id number]  Foot Pain  This is a recurrent problem. The current episode started 1 to 4 weeks ago. The problem occurs constantly. The problem has been unchanged. The symptoms are aggravated by walking and standing. She has tried NSAIDs for the symptoms. The treatment provided mild relief.    For a yr pain has been sig  With walking would develop a sig pain  When pain is really bad toes stiffent and can not get straight  Very painful when it kicks in   Pain bad and crampy  Some posible swelling  Slightly swelling   Job two and a half yrs  Has on occas been worse  Trying to lose weight right now,    Bilateral foot pain, with the right foot being worse. Hurts when she is walking or putting pressure on them.Reports that her toes cramp. No other concerns at the time.   Review of Systems No headache, no major weight loss or weight gain, no chest pain no back pain abdominal pain no change in bowel habits complete ROS otherwise negative     Objective:   Physical Exam  Alert and oriented, vitals reviewed and stable, NAD ENT-TM's and ext canals WNL bilat via otoscopic exam Soft palate, tonsils and post pharynx WNL via oropharyngeal exam Neck-symmetric, no masses; thyroid nonpalpable and nontender Pulmonary-no tachypnea or accessory muscle use; Clear without wheezes via auscultation Card--no abnrml murmurs, rhythm reg and rate WNL Carotid pulses symmetric, without bruits  Feet sensation good pulses excellent anatomy within normal limits       Assessment & Plan:  Impression 1 foot pain. Very long discussion held. Very low chance of neuropathy. Very low chance of osteoarthritis or fasciitis. Very low chance of arterial compromise. All discussed at great length. Likely painful foot cramps discussed plan regular exercise encourage. Proper foot footwear discussed. Check some blood work for  possible etiology to cramps #2 family history of hemochromatosis. No recent blood work check iron status. #3 hyperlipidemia status uncertain plan check all blood work is noted. Exercise encourage.

## 2017-04-09 IMAGING — MR MR BREAST BILATERAL W WO CONTRAST
6 of 13 series · 24 of 48 positions shown · IV contrast (18ml multihance)
Comparison: Previous exam(s).

CLINICAL DATA: 39-year-old female with a strong family history of
breast cancer.

EXAM:
BILATERAL BREAST MRI WITH AND WITHOUT CONTRAST
TECHNIQUE: Multiplanar, multisequence MR images of both breasts were obtained
prior to and following the intravenous administration of ml of
MultiHance.

[Series 2: T2 · axial · 3.0mm · 0.47mm/px · z∈[-84,+78]mm · 3 of 55 slices shown]
[im 1/55]
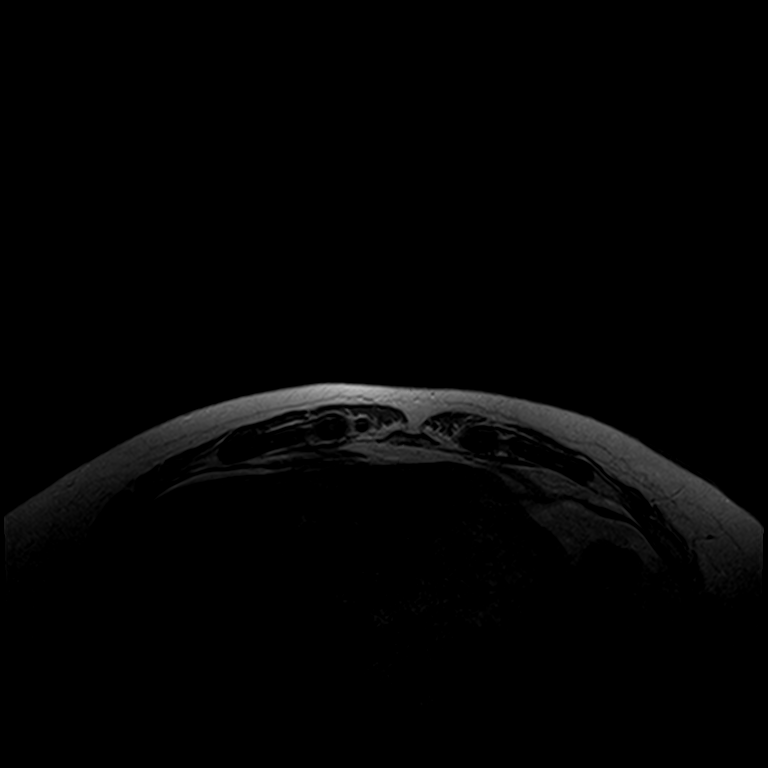
[im 28/55]
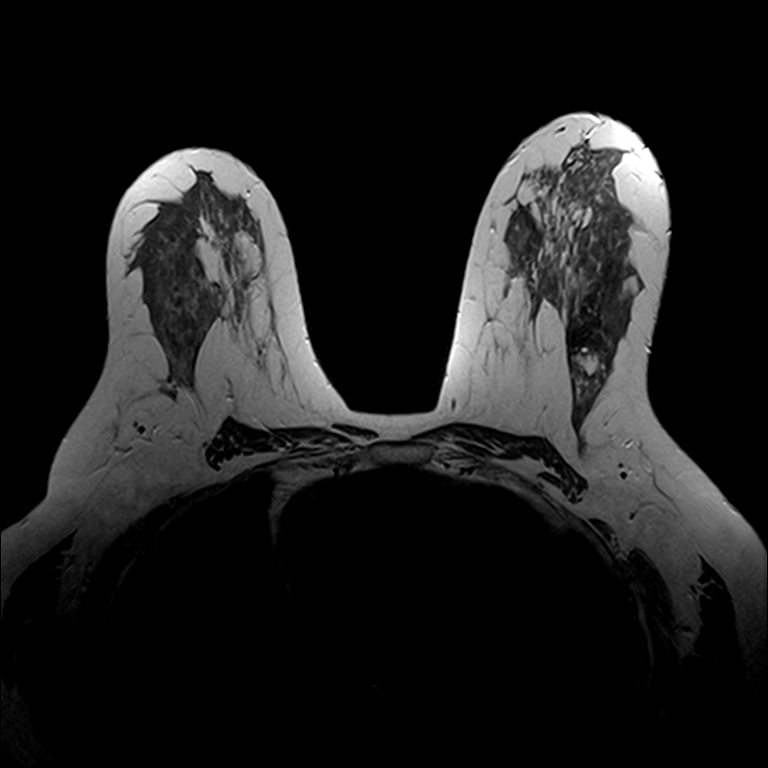
[im 55/55]
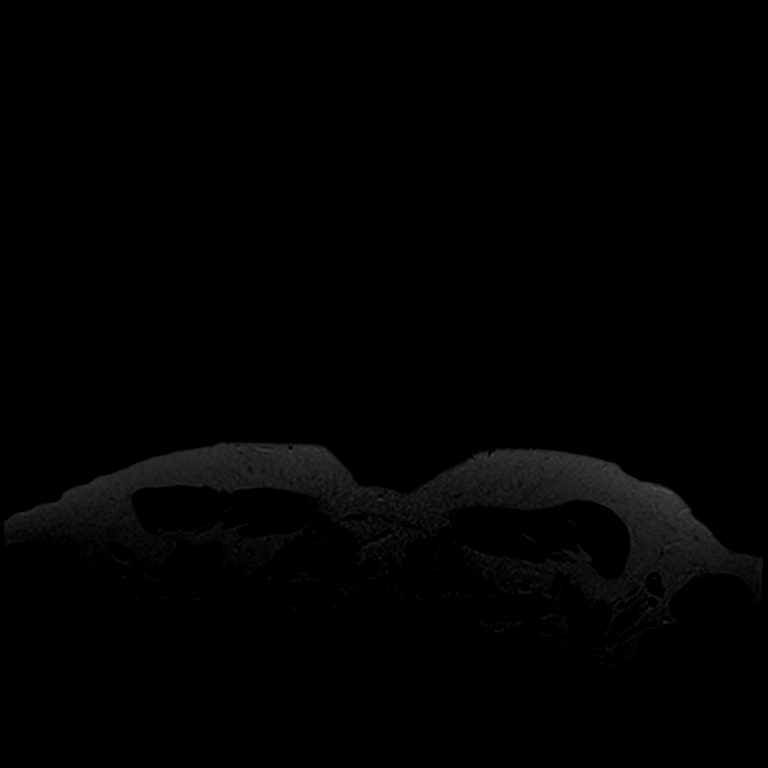

[Series 3: t2_tirm_tra ipat (a-p) · axial · 3.0mm · 0.70mm/px · z∈[-84,+78]mm · 2 of 55 slices shown]
[im 1/55]
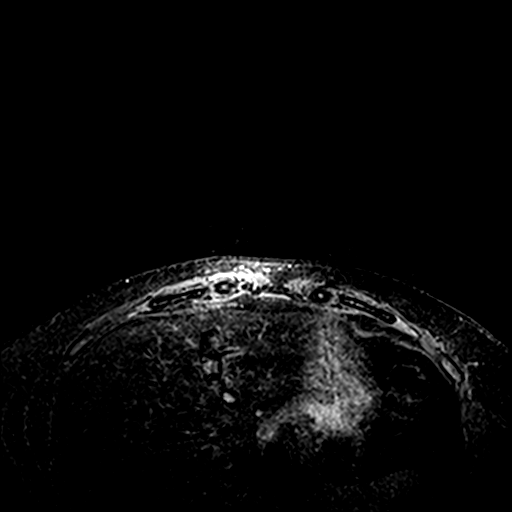
[im 55/55]
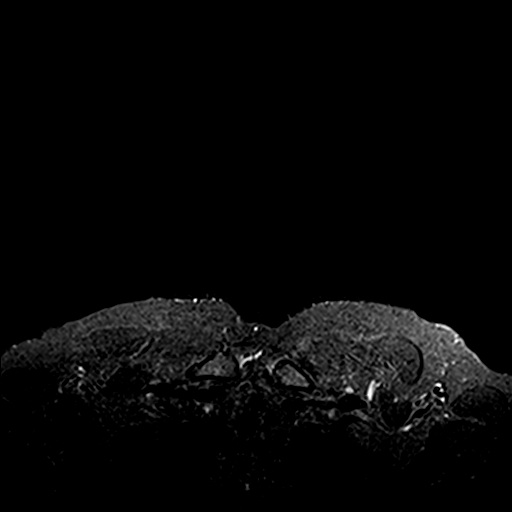

[Series 4: fl3d pre-cm no · axial · non-contrast · 1.2mm · 0.94mm/px · z∈[-89,+83]mm · 5 of 144 slices shown]
[im 1/144]
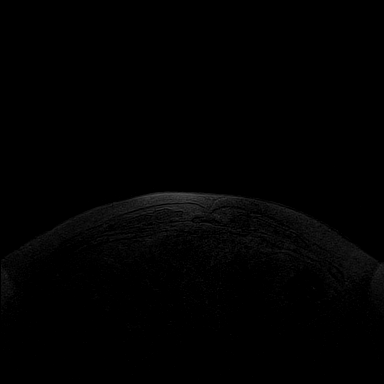
[im 36/144]
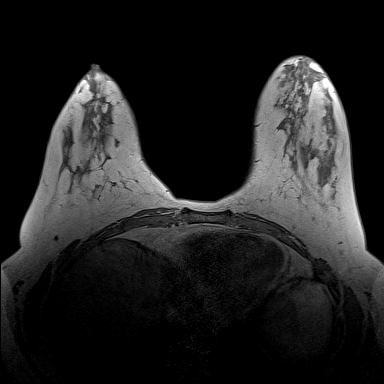
[im 72/144]
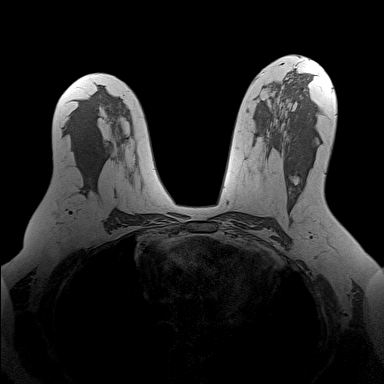
[im 108/144]
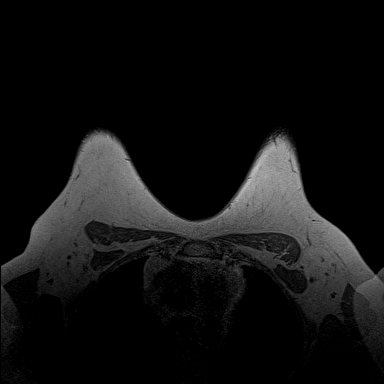
[im 144/144]
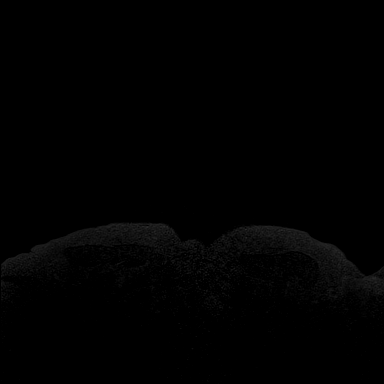

[Series 5: fl3d pre-cm · axial · non-contrast · 1.2mm · 0.94mm/px · z∈[-89,+83]mm · 5 of 144 slices shown]
[im 1/144]
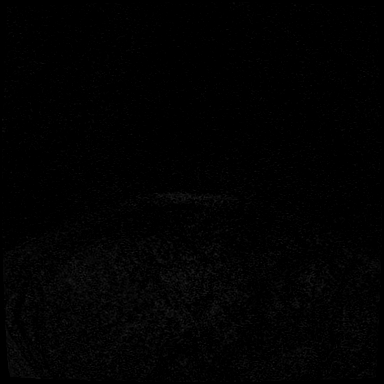
[im 36/144]
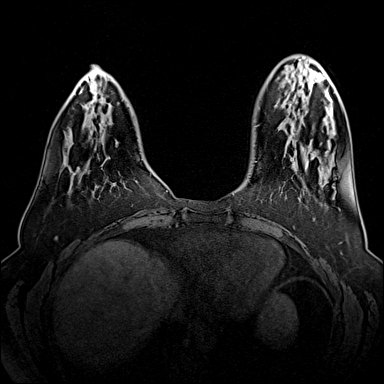
[im 72/144]
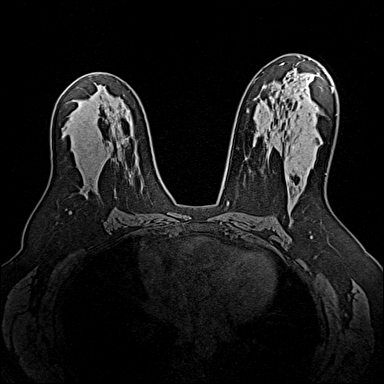
[im 108/144]
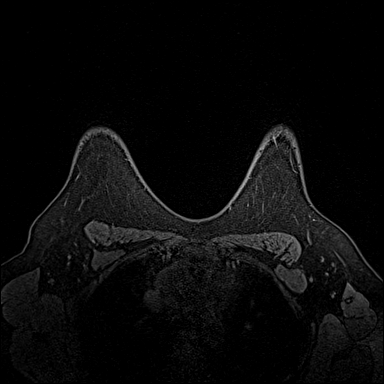
[im 144/144]
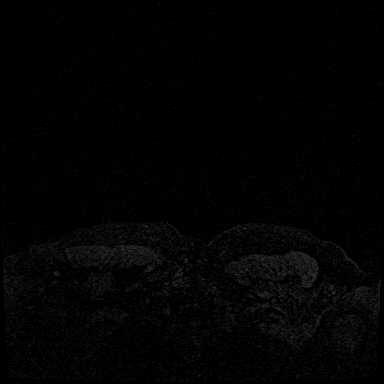

[Series 6: fl3d post immediate · axial · 1.2mm · 0.94mm/px · z∈[-89,+83]mm · 5 of 144 slices shown (1 of 2)]
[im 1/144]
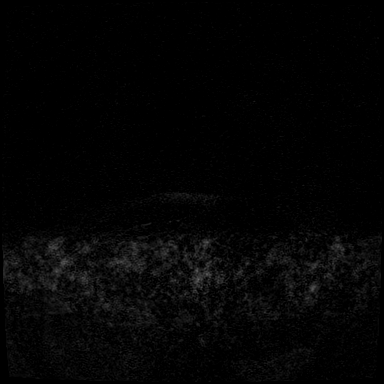
[im 36/144]
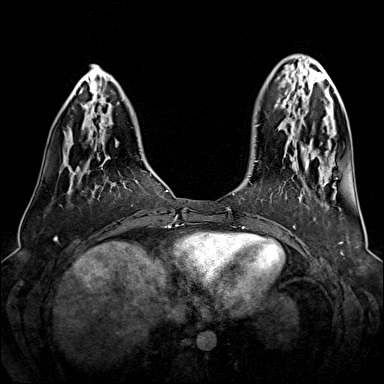
[im 72/144]
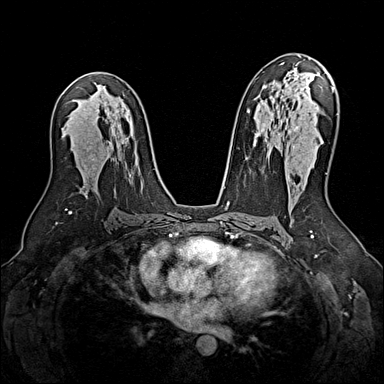
[im 108/144]
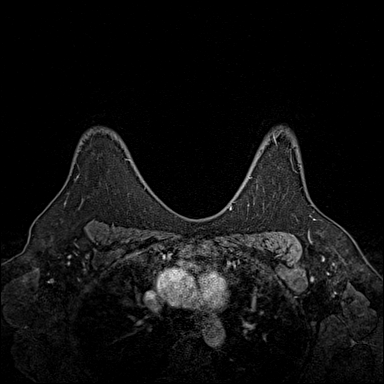
[im 144/144]
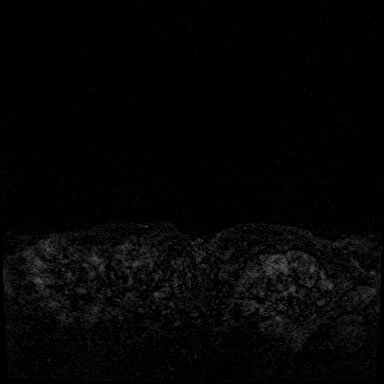

[Series 7: fl3d post immediate · axial · 1.2mm · 0.94mm/px · z∈[-89,+40]mm · 4 of 144 slices shown (2 of 2)]
[im 1/144]
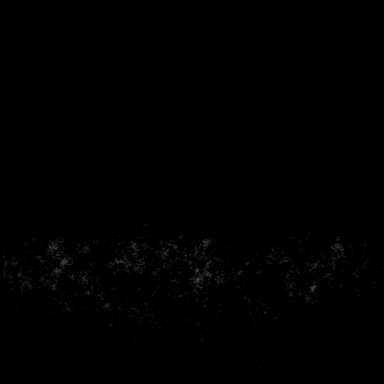
[im 36/144]
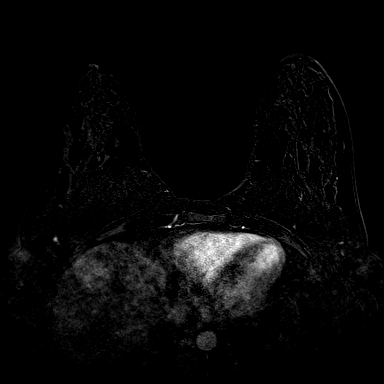
[im 72/144]
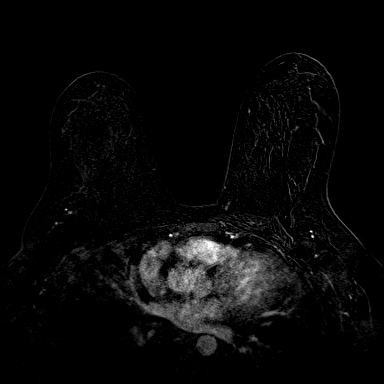
[im 108/144]
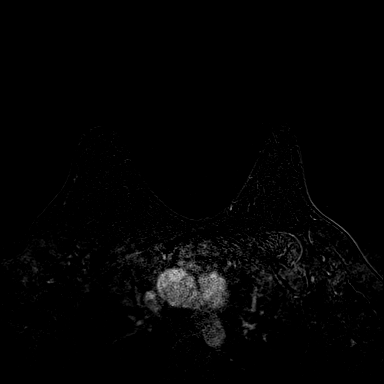

[24 of 48 positions shown; findings below may reference images not displayed]

THREE-DIMENSIONAL MR IMAGE RENDERING ON INDEPENDENT WORKSTATION:

Three-dimensional MR images were rendered by post-processing of the
original MR data on an independent workstation. The
three-dimensional MR images were interpreted, and findings are
reported in the following complete MRI report for this study. Three
dimensional images were evaluated at the independent [employer]
workstation
FINDINGS: Breast composition: c.  Heterogeneous fibroglandular tissue.

Background parenchymal enhancement: Mild.

Right breast: No suspicious rapidly enhancing masses or abnormal
areas of enhancement in the right breast to suggest malignancy.

Left breast: No suspicious rapidly enhancing masses or abnormal
areas of enhancement in the left breast to suggest malignancy.

Lymph nodes: No morphologically abnormal axillary lymph nodes. No
internal mammary lymphadenopathy.

Ancillary findings:  None.
IMPRESSION: No MRI evidence of malignancy in either breast.

RECOMMENDATION:
Recommend annual routine screening mammography.

Based on the recommendations of the American Cancer Society, annual
screening MRI is suggested in addition to annual mammography if the
patient has an estimated lifetime risk of developing breast cancer
which is greater than 20%.

BI-RADS CATEGORY  1: Negative.

## 2017-08-04 DIAGNOSIS — Z719 Counseling, unspecified: Secondary | ICD-10-CM | POA: Diagnosis not present

## 2017-08-13 DIAGNOSIS — Z719 Counseling, unspecified: Secondary | ICD-10-CM | POA: Diagnosis not present

## 2017-08-17 ENCOUNTER — Other Ambulatory Visit: Payer: Self-pay | Admitting: *Deleted

## 2017-08-17 ENCOUNTER — Telehealth: Payer: Self-pay

## 2017-08-17 MED ORDER — OSELTAMIVIR PHOSPHATE 75 MG PO CAPS: 75.0000 mg | ORAL_CAPSULE | Freq: Two times a day (BID) | ORAL | 0 refills | Status: DC

## 2017-08-17 NOTE — Telephone Encounter (Signed)
Patient called today stating she has a headache for a week now and a stuffy nose, no fever. She states she does not have a way to get here and wants to know what can be done for her without her coming in.I told her I would inform you of this but hard to treat over the phone with out seeing. Please advise.

## 2017-08-17 NOTE — Telephone Encounter (Signed)
This ay have been her own prtic take on the flu, too late for flu abx, symtom care with ibuprofen and nasal decongestants such as oral sudafed

## 2017-08-17 NOTE — Telephone Encounter (Signed)
Patient is aware 

## 2017-08-20 DIAGNOSIS — Z719 Counseling, unspecified: Secondary | ICD-10-CM | POA: Diagnosis not present

## 2017-08-27 DIAGNOSIS — Z719 Counseling, unspecified: Secondary | ICD-10-CM | POA: Diagnosis not present

## 2017-09-03 DIAGNOSIS — Z719 Counseling, unspecified: Secondary | ICD-10-CM | POA: Diagnosis not present

## 2017-09-10 DIAGNOSIS — Z719 Counseling, unspecified: Secondary | ICD-10-CM | POA: Diagnosis not present

## 2017-09-17 DIAGNOSIS — Z719 Counseling, unspecified: Secondary | ICD-10-CM | POA: Diagnosis not present

## 2017-09-24 DIAGNOSIS — Z719 Counseling, unspecified: Secondary | ICD-10-CM | POA: Diagnosis not present

## 2017-10-01 DIAGNOSIS — Z719 Counseling, unspecified: Secondary | ICD-10-CM | POA: Diagnosis not present

## 2017-10-08 DIAGNOSIS — Z719 Counseling, unspecified: Secondary | ICD-10-CM | POA: Diagnosis not present

## 2017-10-15 DIAGNOSIS — Z719 Counseling, unspecified: Secondary | ICD-10-CM | POA: Diagnosis not present

## 2017-10-22 DIAGNOSIS — Z719 Counseling, unspecified: Secondary | ICD-10-CM | POA: Diagnosis not present

## 2017-10-29 DIAGNOSIS — Z719 Counseling, unspecified: Secondary | ICD-10-CM | POA: Diagnosis not present

## 2017-12-04 ENCOUNTER — Other Ambulatory Visit: Payer: Self-pay | Admitting: Radiology

## 2017-12-04 DIAGNOSIS — N6310 Unspecified lump in the right breast, unspecified quadrant: Secondary | ICD-10-CM

## 2017-12-04 DIAGNOSIS — N6459 Other signs and symptoms in breast: Secondary | ICD-10-CM | POA: Diagnosis not present

## 2017-12-14 ENCOUNTER — Ambulatory Visit
Admission: RE | Admit: 2017-12-14 | Discharge: 2017-12-14 | Disposition: A | Discharge status: Home / Self Care | Payer: 59 | Source: Ambulatory Visit | Attending: Radiology | Admitting: Radiology

## 2017-12-14 ENCOUNTER — Ambulatory Visit
Admission: RE | Admit: 2017-12-14 | Discharge: 2017-12-14 | Disposition: A | Discharge status: Home / Self Care | Payer: PRIVATE HEALTH INSURANCE | Source: Ambulatory Visit | Attending: Radiology | Admitting: Radiology

## 2017-12-14 DIAGNOSIS — R922 Inconclusive mammogram: Secondary | ICD-10-CM | POA: Diagnosis not present

## 2017-12-14 DIAGNOSIS — N6310 Unspecified lump in the right breast, unspecified quadrant: Secondary | ICD-10-CM

## 2017-12-14 DIAGNOSIS — N6321 Unspecified lump in the left breast, upper outer quadrant: Secondary | ICD-10-CM | POA: Diagnosis not present

## 2018-01-08 DIAGNOSIS — Z01419 Encounter for gynecological examination (general) (routine) without abnormal findings: Secondary | ICD-10-CM | POA: Diagnosis not present

## 2018-01-08 DIAGNOSIS — Z803 Family history of malignant neoplasm of breast: Secondary | ICD-10-CM | POA: Diagnosis not present

## 2018-01-08 DIAGNOSIS — Z800 Family history of malignant neoplasm of digestive organs: Secondary | ICD-10-CM | POA: Diagnosis not present

## 2018-01-08 DIAGNOSIS — Z86010 Personal history of colonic polyps: Secondary | ICD-10-CM | POA: Diagnosis not present

## 2018-02-19 DIAGNOSIS — Z809 Family history of malignant neoplasm, unspecified: Secondary | ICD-10-CM | POA: Diagnosis not present

## 2018-03-09 ENCOUNTER — Encounter: Payer: Self-pay | Admitting: Family Medicine

## 2018-03-09 ENCOUNTER — Ambulatory Visit: Payer: 59 | Admitting: Family Medicine

## 2018-03-09 VITALS — BP 110/74 | Temp 98.9°F | Ht 65.0 in | Wt 190.0 lb

## 2018-03-09 DIAGNOSIS — J019 Acute sinusitis, unspecified: Secondary | ICD-10-CM | POA: Diagnosis not present

## 2018-03-09 DIAGNOSIS — J029 Acute pharyngitis, unspecified: Secondary | ICD-10-CM | POA: Diagnosis not present

## 2018-03-09 LAB — POCT RAPID STREP A (OFFICE): Rapid Strep A Screen: NEGATIVE

## 2018-03-09 MED ORDER — AMOXICILLIN-POT CLAVULANATE 875-125 MG PO TABS: 1.0000 | ORAL_TABLET | Freq: Two times a day (BID) | ORAL | 0 refills | Status: DC

## 2018-03-09 NOTE — Progress Notes (Signed)
   Subjective:    Patient ID: Alicia Ross, female    DOB: 1975/11/07, 42 y.o.   MRN: [id number]  Sinusitis  This is a new problem. Episode onset: one week. Associated symptoms include congestion, coughing, ear pain, headaches and a sore throat. Pertinent negatives include no shortness of breath. (Fever, abdominal pain) Treatments tried: loratadine and motrin.   Results for orders placed or performed in visit on 03/09/18  POCT rapid strep A  Result Value Ref Range   Rapid Strep A Screen Negative Negative   Significant fullness in the left ear along with head congestion sinus pressure not feeling good denies wheezing difficulty breathing  Review of Systems  Constitutional: Positive for fatigue and fever. Negative for activity change.  HENT: Positive for congestion, ear pain, rhinorrhea and sore throat.   Eyes: Negative for discharge.  Respiratory: Positive for cough. Negative for shortness of breath and wheezing.   Cardiovascular: Negative for chest pain.  Neurological: Positive for headaches.       Objective:   Physical Exam  Constitutional: She appears well-developed.  HENT:  Head: Normocephalic.  Nose: Nose normal.  Mouth/Throat: Oropharynx is clear and moist. No oropharyngeal exudate.  Pharyngeal redness  Neck: Neck supple.  Cardiovascular: Normal rate and normal heart sounds.  No murmur heard. Pulmonary/Chest: Effort normal and breath sounds normal. She has no wheezes.  Lymphadenopathy:    She has no cervical adenopathy.  Skin: Skin is warm and dry.  Nursing note and vitals reviewed.   Patient not toxic Does not need x-rays or lab work      Assessment & Plan:  Viral illness Strep test negative Also acute rhinosinusitis Antibiotics prescribed warning signs discussed follow-up if ongoing troubles Work note given for the next few days

## 2018-03-31 DIAGNOSIS — N62 Hypertrophy of breast: Secondary | ICD-10-CM | POA: Diagnosis not present

## 2018-03-31 DIAGNOSIS — M542 Cervicalgia: Secondary | ICD-10-CM | POA: Diagnosis not present

## 2018-03-31 DIAGNOSIS — M549 Dorsalgia, unspecified: Secondary | ICD-10-CM | POA: Diagnosis not present

## 2018-09-24 ENCOUNTER — Other Ambulatory Visit: Payer: Self-pay

## 2018-09-24 ENCOUNTER — Ambulatory Visit: Payer: 59 | Admitting: Family Medicine

## 2018-09-24 ENCOUNTER — Encounter: Payer: Self-pay | Admitting: Family Medicine

## 2018-09-24 VITALS — BP 122/74 | Temp 99.0°F | Ht 65.0 in | Wt 199.8 lb

## 2018-09-24 DIAGNOSIS — R509 Fever, unspecified: Secondary | ICD-10-CM

## 2018-09-24 DIAGNOSIS — B349 Viral infection, unspecified: Secondary | ICD-10-CM

## 2018-09-24 LAB — POCT INFLUENZA A/B
Influenza A, POC: NEGATIVE
Influenza B, POC: NEGATIVE

## 2018-09-24 NOTE — Progress Notes (Signed)
   Subjective:    Patient ID: Kamiya Acord, female    DOB: [birth year], 43 y.o.   MRN: [id number]  Sinusitis  This is a new problem. Episode onset: 3 days. Associated symptoms include congestion, ear pain and a sore throat. (Achy, headache, cough,) Treatments tried: motrin, claritin.    Sore throat on tues day   Scratch    Bad headache   achey late yesterday   No obv fever   Energy level zapped    hesitated to com e in    Ear on both sides uncomfortable   Some dranage for the nose   Results for orders placed or performed in visit on 09/24/18  POCT Influenza A/B  Result Value Ref Range   Influenza A, POC Negative Negative   Influenza B, POC Negative Negative        Review of Systems  HENT: Positive for congestion, ear pain and sore throat.        Objective:   Physical Exam  Alert active good hydration mild nasal congestion pharynx normal neck supple lungs clear.  Heart rate and rhythm.      Assessment & Plan:  Impression viral syndrome plan symptom care discussed warning signs discussed

## 2018-11-17 ENCOUNTER — Other Ambulatory Visit: Payer: Self-pay | Admitting: Obstetrics and Gynecology

## 2018-11-17 DIAGNOSIS — N6310 Unspecified lump in the right breast, unspecified quadrant: Secondary | ICD-10-CM

## 2018-11-22 ENCOUNTER — Other Ambulatory Visit: Payer: Self-pay | Admitting: Obstetrics and Gynecology

## 2018-12-22 ENCOUNTER — Telehealth: Payer: Self-pay | Admitting: Family Medicine

## 2018-12-22 DIAGNOSIS — Z13220 Encounter for screening for lipoid disorders: Secondary | ICD-10-CM

## 2018-12-22 DIAGNOSIS — Z0000 Encounter for general adult medical examination without abnormal findings: Secondary | ICD-10-CM

## 2018-12-22 DIAGNOSIS — Z8349 Family history of other endocrine, nutritional and metabolic diseases: Secondary | ICD-10-CM

## 2018-12-22 NOTE — Telephone Encounter (Signed)
Please order: CMP with GFR Lipid Ferritin  Iron  CBC with diff  Thanks.

## 2018-12-22 NOTE — Telephone Encounter (Signed)
Having CPE in July and would like to have labs in advance.  Wants physical labs and Ferratin and iron checked due to being a carrier of hemochromatosis. [facility]

## 2018-12-22 NOTE — Telephone Encounter (Signed)
Labs ordered. [facility] 

## 2018-12-23 NOTE — Telephone Encounter (Signed)
Pt contacted and informed that labs were placed. Pt verbalized understanding

## 2019-01-21 LAB — IRON AND TIBC
Iron Saturation: 53 % (ref 15–55)
Iron: 148 ug/dL (ref 27–159)
Total Iron Binding Capacity: 280 ug/dL (ref 250–450)
UIBC: 132 ug/dL (ref 131–425)

## 2019-01-21 LAB — CBC WITH DIFFERENTIAL/PLATELET
Basophils Absolute: 0 10*3/uL (ref 0.0–0.2)
Basos: 0 %
EOS (ABSOLUTE): 0.1 10*3/uL (ref 0.0–0.4)
Eos: 1 %
Hematocrit: 42.8 % (ref 34.0–46.6)
Hemoglobin: 14.5 g/dL (ref 11.1–15.9)
Immature Grans (Abs): 0 10*3/uL (ref 0.0–0.1)
Immature Granulocytes: 0 %
Lymphocytes Absolute: 2.5 10*3/uL (ref 0.7–3.1)
Lymphs: 36 %
MCH: 31.8 pg (ref 26.6–33.0)
MCHC: 33.9 g/dL (ref 31.5–35.7)
MCV: 94 fL (ref 79–97)
Monocytes Absolute: 0.6 10*3/uL (ref 0.1–0.9)
Monocytes: 8 %
Neutrophils Absolute: 3.9 10*3/uL (ref 1.4–7.0)
Neutrophils: 55 %
Platelets: 185 10*3/uL (ref 150–450)
RBC: 4.56 x10E6/uL (ref 3.77–5.28)
RDW: 12.1 % (ref 11.7–15.4)
WBC: 7 10*3/uL (ref 3.4–10.8)

## 2019-01-21 LAB — LIPID PANEL
Chol/HDL Ratio: 5 ratio — ABNORMAL HIGH (ref 0.0–4.4)
Cholesterol, Total: 179 mg/dL (ref 100–199)
HDL: 36 mg/dL — ABNORMAL LOW (ref >39)
LDL Calculated: 106 mg/dL — ABNORMAL HIGH (ref 0–99)
Triglycerides: 183 mg/dL — ABNORMAL HIGH (ref 0–149)
VLDL Cholesterol Cal: 37 mg/dL (ref 5–40)

## 2019-01-21 LAB — FERRITIN: Ferritin: 87 ng/mL (ref 15–150)

## 2019-01-24 ENCOUNTER — Other Ambulatory Visit: Payer: Self-pay | Admitting: Obstetrics and Gynecology

## 2019-01-24 DIAGNOSIS — R928 Other abnormal and inconclusive findings on diagnostic imaging of breast: Secondary | ICD-10-CM

## 2019-01-28 ENCOUNTER — Encounter: Payer: 59 | Admitting: Nurse Practitioner

## 2019-01-28 ENCOUNTER — Ambulatory Visit
Admission: RE | Admit: 2019-01-28 | Discharge: 2019-01-28 | Disposition: A | Discharge status: Home / Self Care | Payer: 59 | Source: Ambulatory Visit | Attending: Obstetrics and Gynecology | Admitting: Obstetrics and Gynecology

## 2019-01-28 ENCOUNTER — Other Ambulatory Visit: Payer: Self-pay

## 2019-01-28 ENCOUNTER — Ambulatory Visit: Payer: 59

## 2019-01-28 DIAGNOSIS — R928 Other abnormal and inconclusive findings on diagnostic imaging of breast: Secondary | ICD-10-CM

## 2019-02-25 ENCOUNTER — Encounter: Payer: Self-pay | Admitting: Nurse Practitioner

## 2019-02-25 ENCOUNTER — Ambulatory Visit (INDEPENDENT_AMBULATORY_CARE_PROVIDER_SITE_OTHER): Payer: 59 | Admitting: Nurse Practitioner

## 2019-02-25 ENCOUNTER — Other Ambulatory Visit: Payer: Self-pay

## 2019-02-25 ENCOUNTER — Encounter: Payer: 59 | Admitting: Nurse Practitioner

## 2019-02-25 VITALS — BP 132/80 | Ht 65.5 in | Wt 207.8 lb

## 2019-02-25 DIAGNOSIS — Z131 Encounter for screening for diabetes mellitus: Secondary | ICD-10-CM

## 2019-02-25 DIAGNOSIS — Z124 Encounter for screening for malignant neoplasm of cervix: Secondary | ICD-10-CM | POA: Diagnosis not present

## 2019-02-25 DIAGNOSIS — Z0000 Encounter for general adult medical examination without abnormal findings: Secondary | ICD-10-CM | POA: Diagnosis not present

## 2019-02-25 DIAGNOSIS — Z1151 Encounter for screening for human papillomavirus (HPV): Secondary | ICD-10-CM | POA: Diagnosis not present

## 2019-02-25 LAB — POCT GLUCOSE (DEVICE FOR HOME USE): POC Glucose: 93 mg/dl (ref 70–99)

## 2019-02-25 NOTE — Progress Notes (Signed)
Subjective:    Patient ID: Alicia Ross, female    DOB: October 23, 1975, 43 y.o.   MRN: [id number]  HPI The patient comes in today for a wellness visit. Patient wants to review lab work done in July 2020. Additionally, patient would like pap smear done. Pt in good overall health. Pt has a history of allergies with increased symptoms, such as postnasal drip, ear fullness, and rhinorrhea. Takes Claritin daily with some improvement. Patient has been exercising regularly and eating healthy. Has regular eye and dental exam.   A review of their health history was completed. A review of medications was also completed.  Any needed refills; not on any prescription meds  Social: Denies tobacco or alcohol use.  Eating habits: health conscious  Falls/  MVA accidents in past few months: none  Regular exercise: 30 -40 min work out at least 4 times a week   Specialist pt sees on regular basis: Dermatology for skin cancer screening  Sexual: Last menstrual cycle 02/17/2019. Same sexual partner.  Additional concerns: None    Review of Systems  Constitutional: Negative for activity change, appetite change and chills.  HENT: Positive for congestion and postnasal drip. Negative for ear discharge.   Respiratory: Negative for cough, chest tightness, shortness of breath and wheezing.   Cardiovascular: Negative for chest pain and leg swelling.  Gastrointestinal: Negative for blood in stool, constipation, diarrhea, nausea and vomiting.  Genitourinary: Negative for difficulty urinating, dysuria, enuresis, frequency, genital sores, menstrual problem, pelvic pain, urgency and vaginal discharge.   Depression screen PHQ 2/9 02/25/2019  Decreased Interest 0  Down, Depressed, Hopeless 0  PHQ - 2 Score 0        Objective:   Physical Exam Constitutional:      Appearance: Normal appearance.  HENT:     Right Ear: Tympanic membrane is retracted.     Left Ear: Tympanic membrane is retracted.     Nose: Mucosal  edema present.     Right Turbinates: Pale.     Left Turbinates: Pale.  Neck:     Thyroid: No thyroid mass or thyromegaly.  Cardiovascular:     Rate and Rhythm: Normal rate and regular rhythm.     Heart sounds: Normal heart sounds.  Pulmonary:     Effort: Pulmonary effort is normal.     Breath sounds: Normal breath sounds.  Abdominal:     Palpations: Abdomen is soft. There is no mass.     Tenderness: There is no abdominal tenderness.  Genitourinary:    Exam position: Lithotomy position.     Pubic Area: No rash or pubic lice.      Labia:        Right: No rash or lesion.        Left: No rash or lesion.      Vagina: Bleeding present.     Adnexa: Right adnexa normal and left adnexa normal.       Right: No mass.         Left: No mass.       Comments: slighly dark-brown discharge at cervical os Lymphadenopathy:     Cervical: Cervical adenopathy present.     Right cervical: Superficial cervical adenopathy present.     Left cervical: Superficial cervical adenopathy present.  Neurological:     Mental Status: She is alert.  Psychiatric:        Mood and Affect: Mood normal.        Behavior: Behavior normal.  Thought Content: Thought content normal.        Judgment: Judgment normal.   No CMT.    Recent Results (from the past 2160 hour(s))  Lipid Profile     Status: Abnormal   Collection Time: 01/20/19  8:13 AM  Result Value Ref Range   Cholesterol, Total 179 100 - 199 mg/dL   Triglycerides 183 (H) 0 - 149 mg/dL   HDL 36 (L) >39 mg/dL   VLDL Cholesterol Cal 37 5 - 40 mg/dL   LDL Calculated 106 (H) 0 - 99 mg/dL   Chol/HDL Ratio 5.0 (H) 0.0 - 4.4 ratio    Comment:                                   T. Chol/HDL Ratio                                             Men  Women                               1/2 Avg.Risk  3.4    3.3                                   Avg.Risk  5.0    4.4                                2X Avg.Risk  9.6    7.1                                3X  Avg.Risk 23.4   11.0   Ferritin     Status: None   Collection Time: 01/20/19  8:13 AM  Result Value Ref Range   Ferritin 87 15 - 150 ng/mL  Iron Binding Cap (TIBC)([facility])     Status: None   Collection Time: 01/20/19  8:13 AM  Result Value Ref Range   Total Iron Binding Capacity 280 250 - 450 ug/dL   UIBC 132 131 - 425 ug/dL   Iron 148 27 - 159 ug/dL   Iron Saturation 53 15 - 55 %  CBC with Differential     Status: None   Collection Time: 01/20/19  8:13 AM  Result Value Ref Range   WBC 7.0 3.4 - 10.8 x10E3/uL   RBC 4.56 3.77 - 5.28 x10E6/uL   Hemoglobin 14.5 11.1 - 15.9 g/dL   Hematocrit 42.8 34.0 - 46.6 %   MCV 94 79 - 97 fL   MCH 31.8 26.6 - 33.0 pg   MCHC 33.9 31.5 - 35.7 g/dL   RDW 12.1 11.7 - 15.4 %   Platelets 185 150 - 450 x10E3/uL   Neutrophils 55 Not Estab. %   Lymphs 36 Not Estab. %   Monocytes 8 Not Estab. %   Eos 1 Not Estab. %   Basos 0 Not Estab. %   Neutrophils Absolute 3.9 1.4 - 7.0 x10E3/uL   Lymphocytes Absolute 2.5 0.7 - 3.1 x10E3/uL   Monocytes Absolute 0.6 0.1 - 0.9 x10E3/uL   EOS (  ABSOLUTE) 0.1 0.0 - 0.4 x10E3/uL   Basophils Absolute 0.0 0.0 - 0.2 x10E3/uL   Immature Granulocytes 0 Not Estab. %   Immature Grans (Abs) 0.0 0.0 - 0.1 x10E3/uL  POCT Glucose (Device for Home Use)     Status: None   Collection Time: 02/25/19  1:24 PM  Result Value Ref Range   Glucose Fasting, POC     POC Glucose 93 70 - 99 mg/dl   Reviewed labs with patient.      Assessment & Plan:  1. Routine general medical examination at a health care facility - POCT Glucose (Device for Home Use) - Pap IG and HPV (high risk) DNA detection  2. Screening for diabetes mellitus - POCT Glucose (Device for Home Use)  3. Screening for HPV (human papillomavirus) - Pap IG and HPV (high risk) DNA detection  4. Screening for cervical cancer - Pap IG and HPV (high risk) DNA detection  -Continue dietary and lifestyle modifications. Pt having increased allergy symptoms.  Recommended patient take OTC steroid nasal spray for symptom management.  Return in about 1 year (around 02/25/2020) for physical.

## 2019-02-25 NOTE — Patient Instructions (Addendum)
Steriod nasal spray (Nasacort) to help with allergy symptoms

## 2019-03-02 LAB — PAP IG AND HPV HIGH-RISK: HPV, high-risk: NEGATIVE

## 2019-08-15 ENCOUNTER — Encounter: Payer: Self-pay | Admitting: Family Medicine

## 2020-01-09 ENCOUNTER — Telehealth: Payer: Self-pay | Admitting: Family Medicine

## 2020-01-09 DIAGNOSIS — Z13220 Encounter for screening for lipoid disorders: Secondary | ICD-10-CM

## 2020-01-09 DIAGNOSIS — Z79899 Other long term (current) drug therapy: Secondary | ICD-10-CM

## 2020-01-09 DIAGNOSIS — R5383 Other fatigue: Secondary | ICD-10-CM

## 2020-01-09 DIAGNOSIS — Z0000 Encounter for general adult medical examination without abnormal findings: Secondary | ICD-10-CM

## 2020-01-09 DIAGNOSIS — Z1321 Encounter for screening for nutritional disorder: Secondary | ICD-10-CM

## 2020-01-09 NOTE — Telephone Encounter (Signed)
Please order: CBC with diff Met 7 Lipid Liver  Vit D Ferritin  Iron panel with TIBC Thanks

## 2020-01-09 NOTE — Telephone Encounter (Signed)
Pt made appt on 08/20 for Phy and needs labs pt is requesting fertian, iron as well

## 2020-01-09 NOTE — Telephone Encounter (Signed)
Lab orders placed and pt is aware 

## 2020-02-25 LAB — CBC WITH DIFFERENTIAL/PLATELET
Basophils Absolute: 0 10*3/uL (ref 0.0–0.2)
Basos: 0 %
EOS (ABSOLUTE): 0.1 10*3/uL (ref 0.0–0.4)
Eos: 1 %
Hematocrit: 42.9 % (ref 34.0–46.6)
Hemoglobin: 14.4 g/dL (ref 11.1–15.9)
Immature Grans (Abs): 0 10*3/uL (ref 0.0–0.1)
Immature Granulocytes: 0 %
Lymphocytes Absolute: 2.6 10*3/uL (ref 0.7–3.1)
Lymphs: 38 %
MCH: 31.4 pg (ref 26.6–33.0)
MCHC: 33.6 g/dL (ref 31.5–35.7)
MCV: 94 fL (ref 79–97)
Monocytes Absolute: 0.6 10*3/uL (ref 0.1–0.9)
Monocytes: 8 %
Neutrophils Absolute: 3.6 10*3/uL (ref 1.4–7.0)
Neutrophils: 53 %
Platelets: 212 10*3/uL (ref 150–450)
RBC: 4.58 x10E6/uL (ref 3.77–5.28)
RDW: 12.4 % (ref 11.7–15.4)
WBC: 6.9 10*3/uL (ref 3.4–10.8)

## 2020-02-25 LAB — HEPATIC FUNCTION PANEL
ALT: 22 IU/L (ref 0–32)
AST: 14 IU/L (ref 0–40)
Albumin: 4.6 g/dL (ref 3.8–4.8)
Alkaline Phosphatase: 75 IU/L (ref 48–121)
Bilirubin Total: 0.5 mg/dL (ref 0.0–1.2)
Bilirubin, Direct: 0.11 mg/dL (ref 0.00–0.40)
Total Protein: 7.2 g/dL (ref 6.0–8.5)

## 2020-02-25 LAB — LIPID PANEL
Chol/HDL Ratio: 4.5 ratio — ABNORMAL HIGH (ref 0.0–4.4)
Cholesterol, Total: 179 mg/dL (ref 100–199)
HDL: 40 mg/dL (ref >39)
LDL Chol Calc (NIH): 115 mg/dL — ABNORMAL HIGH (ref 0–99)
Triglycerides: 133 mg/dL (ref 0–149)
VLDL Cholesterol Cal: 24 mg/dL (ref 5–40)

## 2020-02-25 LAB — IRON,TIBC AND FERRITIN PANEL
Ferritin: 91 ng/mL (ref 15–150)
Iron Saturation: 41 % (ref 15–55)
Iron: 111 ug/dL (ref 27–159)
Total Iron Binding Capacity: 273 ug/dL (ref 250–450)
UIBC: 162 ug/dL (ref 131–425)

## 2020-02-25 LAB — BASIC METABOLIC PANEL
BUN/Creatinine Ratio: 17 (ref 9–23)
BUN: 13 mg/dL (ref 6–24)
CO2: 21 mmol/L (ref 20–29)
Calcium: 9.4 mg/dL (ref 8.7–10.2)
Chloride: 103 mmol/L (ref 96–106)
Creatinine, Ser: 0.75 mg/dL (ref 0.57–1.00)
GFR calc Af Amer: 111 mL/min/{1.73_m2} (ref >59)
GFR calc non Af Amer: 97 mL/min/{1.73_m2} (ref >59)
Glucose: 95 mg/dL (ref 65–99)
Potassium: 4.7 mmol/L (ref 3.5–5.2)
Sodium: 140 mmol/L (ref 134–144)

## 2020-02-25 LAB — VITAMIN D 25 HYDROXY (VIT D DEFICIENCY, FRACTURES): Vit D, 25-Hydroxy: 12.1 ng/mL — ABNORMAL LOW (ref 30.0–100.0)

## 2020-03-02 ENCOUNTER — Other Ambulatory Visit: Payer: Self-pay

## 2020-03-02 ENCOUNTER — Ambulatory Visit (INDEPENDENT_AMBULATORY_CARE_PROVIDER_SITE_OTHER): Payer: 59 | Admitting: Nurse Practitioner

## 2020-03-02 ENCOUNTER — Encounter: Payer: Self-pay | Admitting: Nurse Practitioner

## 2020-03-02 VITALS — BP 124/70 | HR 93 | Temp 97.9°F | Ht 65.0 in | Wt 210.0 lb

## 2020-03-02 DIAGNOSIS — E559 Vitamin D deficiency, unspecified: Secondary | ICD-10-CM | POA: Diagnosis not present

## 2020-03-02 DIAGNOSIS — N920 Excessive and frequent menstruation with regular cycle: Secondary | ICD-10-CM

## 2020-03-02 DIAGNOSIS — Z0000 Encounter for general adult medical examination without abnormal findings: Secondary | ICD-10-CM

## 2020-03-02 DIAGNOSIS — Z01419 Encounter for gynecological examination (general) (routine) without abnormal findings: Secondary | ICD-10-CM

## 2020-03-02 MED ORDER — VITAMIN D (ERGOCALCIFEROL) 1.25 MG (50000 UNIT) PO CAPS: 50000.0000 [IU] | ORAL_CAPSULE | ORAL | 2 refills | Status: DC

## 2020-03-02 NOTE — Progress Notes (Signed)
The patient comes in today for a wellness visit.    A review of their health history was completed.  A review of medications was also completed.  Any needed refills; not on any meds  Eating habits: health conscious  Falls/  MVA accidents in past few months: none  Regular exercise: none  Specialist pt sees on regular basis: none  Preventative health issues were discussed.   Additional concerns: neck pain

## 2020-03-02 NOTE — Progress Notes (Signed)
Subjective:    Patient ID: Alicia Ross, female    DOB: [birth year], 44 y.o.   MRN: [id number]  HPI The patient comes in today for a wellness visit.    A review of their health history was completed.  A review of medications was also completed.  Any needed refills; none  Eating habits: overall healthy  Regular exercise: walking or exercise 4-5 x per week  Specialist pt sees on regular basis: none  Preventative health issues were discussed.   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 Has had genetic testing through her gyn. Reports that it was normal.  Gets yearly mammograms through another provider. Normal but reports dense breast tissue. Report not available during visit.  Regular vision and dental exams.   Review of Systems  Constitutional: Negative for activity change, appetite change and fatigue.  Respiratory: Negative for cough, chest tightness, shortness of breath and wheezing.   Cardiovascular: Negative for chest pain.  Gastrointestinal: Negative for abdominal distention, abdominal pain, blood in stool, constipation, diarrhea, nausea and vomiting.  Genitourinary: Positive for menstrual problem. Negative for difficulty urinating, dysuria, enuresis, frequency, genital sores, pelvic pain, urgency and vaginal discharge.       Patient reports regular cycles lasting 7 days with heavy flow changing pad/tampon every 30 minutes on her heaviest days.  Musculoskeletal:       Reports very tight tender muscles on the lateral neck area bilaterally. Worse at the end of a work day.   Psychiatric/Behavioral: Negative for dysphoric mood.         Objective:   Physical Exam Constitutional:      General: She is not in acute distress.    Appearance: Normal appearance. She is well-developed.  Neck:     Thyroid: No thyromegaly.      Trachea: No tracheal deviation.     Comments: Thyroid non-tender. No mass or goiter noted.  Cardiovascular:     Rate and Rhythm: Normal rate and regular rhythm.     Heart sounds: Normal heart sounds. No murmur heard.  No gallop.   Pulmonary:     Effort: Pulmonary effort is normal.     Breath sounds: Normal breath sounds.  Chest:     Breasts:        Right: No swelling, inverted nipple, mass, skin change or tenderness.        Left: No swelling, inverted nipple, mass, skin change or tenderness.  Abdominal:     General: There is no distension.     Palpations: Abdomen is soft.     Tenderness: There is no abdominal tenderness.     Comments: Mid abdominal bulging along the midline with raising her head. Non tender. Not noted in the supine position.   Genitourinary:    Vagina: Normal. No vaginal discharge.     Comments: External GU: No rashes or lesions noted. Vaginal tissue pink with no discharge. Cervix normal in appearance no CMT.   Bi-Manual Exam: No tenderness or obvious masses. Exam limited due to abdomnial girth. Musculoskeletal:     Cervical back: Normal range of motion and neck supple.  Lymphadenopathy:     Cervical: No cervical adenopathy.     Upper Body:     Right upper body: No supraclavicular, axillary or pectoral adenopathy.     Left upper body: No supraclavicular, axillary or pectoral  adenopathy.  Skin:    General: Skin is warm and dry.     Findings: No rash.  Neurological:     Mental Status: She is alert and oriented to person, place, and time.  Psychiatric:        Mood and Affect: Mood normal.        Behavior: Behavior normal.        Thought Content: Thought content normal.        Judgment: Judgment normal.    Recent Results (from the past 2160 hour(s))  CBC with Differential     Status: None   Collection Time: 02/24/20  8:20 AM  Result Value Ref Range   WBC 6.9 3.4 - 10.8 x10E3/uL   RBC 4.58 3.77 - 5.28 x10E6/uL   Hemoglobin 14.4 11.1 - 15.9 g/dL    Hematocrit 42.9 34.0 - 46.6 %   MCV 94 79 - 97 fL   MCH 31.4 26.6 - 33.0 pg   MCHC 33.6 31 - 35 g/dL   RDW 12.4 11.7 - 15.4 %   Platelets 212 150 - 450 x10E3/uL   Neutrophils 53 Not Estab. %   Lymphs 38 Not Estab. %   Monocytes 8 Not Estab. %   Eos 1 Not Estab. %   Basos 0 Not Estab. %   Neutrophils Absolute 3.6 1 - 7 x10E3/uL   Lymphocytes Absolute 2.6 0 - 3 x10E3/uL   Monocytes Absolute 0.6 0 - 0 x10E3/uL   EOS (ABSOLUTE) 0.1 0.0 - 0.4 x10E3/uL   Basophils Absolute 0.0 0 - 0 x10E3/uL   Immature Granulocytes 0 Not Estab. %   Immature Grans (Abs) 0.0 0.0 - 0.1 T24P8/KD  Basic Metabolic Panel (BMET)     Status: None   Collection Time: 02/24/20  8:20 AM  Result Value Ref Range   Glucose 95 65 - 99 mg/dL   BUN 13 6 - 24 mg/dL   Creatinine, Ser 0.75 0.57 - 1.00 mg/dL   GFR calc non Af Amer 97 >59 mL/min/1.73   GFR calc Af Amer 111 >59 mL/min/1.73    Comment: **[facility] currently reports eGFR in compliance with the current**   recommendations of the [employer]. [facility] will   update reporting as new guidelines are published from the NKF-ASN   Task force.    BUN/Creatinine Ratio 17 9 - 23   Sodium 140 134 - 144 mmol/L   Potassium 4.7 3.5 - 5.2 mmol/L   Chloride 103 96 - 106 mmol/L   CO2 21 20 - 29 mmol/L   Calcium 9.4 8.7 - 10.2 mg/dL  Lipid Profile     Status: Abnormal   Collection Time: 02/24/20  8:20 AM  Result Value Ref Range   Cholesterol, Total 179 100 - 199 mg/dL   Triglycerides 133 0 - 149 mg/dL   HDL 40 >39 mg/dL   VLDL Cholesterol Cal 24 5 - 40 mg/dL   LDL Chol Calc (NIH) 115 (H) 0 - 99 mg/dL   Chol/HDL Ratio 4.5 (H) 0.0 - 4.4 ratio    Comment:                                   T. Chol/HDL Ratio  Men  Women                               1/2 Avg.Risk  3.4    3.3                                   Avg.Risk  5.0    4.4                                2X Avg.Risk  9.6    7.1                                3X  Avg.Risk 23.4   11.0   Hepatic function panel     Status: None   Collection Time: 02/24/20  8:20 AM  Result Value Ref Range   Total Protein 7.2 6.0 - 8.5 g/dL   Albumin 4.6 3.8 - 4.8 g/dL   Bilirubin Total 0.5 0.0 - 1.2 mg/dL   Bilirubin, Direct 0.11 0.00 - 0.40 mg/dL   Alkaline Phosphatase 75 48 - 121 IU/L   AST 14 0 - 40 IU/L   ALT 22 0 - 32 IU/L  Vitamin D (25 hydroxy)     Status: Abnormal   Collection Time: 02/24/20  8:20 AM  Result Value Ref Range   Vit D, 25-Hydroxy 12.1 (L) 30.0 - 100.0 ng/mL    Comment: Vitamin D deficiency has been defined by the [place] and an Endocrine Society practice guideline as a level of serum 25-OH vitamin D less than 20 ng/mL (1,2). The Endocrine Society went on to further define vitamin D insufficiency as a level between 21 and 29 ng/mL (2). 1. IOM (Institute of Medicine). 2010. Dietary reference    intakes for calcium and D. [place]: The    [employer]. 2. Holick MF, Binkley [place], Bischoff-Ferrari HA, et al.    Evaluation, treatment, and prevention of vitamin D    deficiency: an Endocrine Society clinical practice    guideline. JCEM. 2011 Jul; 96(7):1911-30.   Iron, TIBC and Ferritin Panel     Status: None   Collection Time: 02/24/20  8:20 AM  Result Value Ref Range   Total Iron Binding Capacity 273 250 - 450 ug/dL   UIBC 162 131 - 425 ug/dL   Iron 111 27 - 159 ug/dL   Iron Saturation 41 15 - 55 %   Ferritin 91 15.0 - 150.0 ng/mL   Labs reviewed with patient.         Assessment & Plan:   Problem List Items Addressed This Visit      Other   Menorrhagia with regular cycle   Vitamin D deficiency    Other Visit Diagnoses    Well woman exam    -  Primary     Meds ordered this encounter  Medications  . Vitamin D, Ergocalciferol, (DRISDOL) 1.25 MG (50000 UNIT) CAPS capsule    Sig: Take 1 capsule (50,000 Units total) by mouth every 7 (seven) days.    Dispense:  4 capsule    Refill:  2    Order  Specific Question:   Supervising Provider    Answer:   Sallee Lange A [[id number]]   Once this is  completed start OTC 1000 units per day. Discussed options for menorrhagia. Defers intervention for now. Discussed risk of anemia. Tyrer-Cuzick lifetime breast cancer risk approx 10% (well below high risk of 20%) Continue with yearly mammograms. Encouraged continued weight loss efforts, healthy diet and regular activity.  Return in about 1 year (around 03/02/2021) for physical.

## 2020-05-18 ENCOUNTER — Telehealth: Payer: Self-pay

## 2020-05-18 NOTE — Telephone Encounter (Signed)
Patient left a message on the referral line that she saw Hoyle Sauer and had a hernia and Hoyle Sauer had mentioned referring her somewhere.  I don't see this in the last couple of notes.

## 2020-05-18 NOTE — Telephone Encounter (Signed)
We may have discussed this. Nothing specific in my notes. Confirm that this is an abdominal hernia. Please refer to general surgeon. No urgency to referral if asymptomatic. Thanks.

## 2020-05-21 ENCOUNTER — Other Ambulatory Visit: Payer: Self-pay | Admitting: *Deleted

## 2020-05-21 DIAGNOSIS — K469 Unspecified abdominal hernia without obstruction or gangrene: Secondary | ICD-10-CM

## 2020-05-21 NOTE — Telephone Encounter (Signed)
Pt states she is having some issues. Having a lot of pressure in the area and a lot of digestion issues. Has gotten worse since seeing carolyn. Happens about once a week. Last for several days. No fever. Urgent referral put in and pt advised if carolyn has other recommendations we will call her back.

## 2020-05-25 ENCOUNTER — Other Ambulatory Visit: Payer: Self-pay | Admitting: Nurse Practitioner

## 2020-05-25 MED ORDER — PANTOPRAZOLE SODIUM 40 MG PO TBEC: 40.0000 mg | DELAYED_RELEASE_TABLET | Freq: Every day | ORAL | 2 refills | Status: DC

## 2020-05-25 NOTE — Telephone Encounter (Signed)
I called her to discuss and left voicemail last week. Where is her pain/discomfort? Reflux symptoms? N/V? Diarrhea or constipation? While she has a hernia there may be other issues causing her symptoms. Thanks.

## 2020-05-25 NOTE — Telephone Encounter (Signed)
Patient stated the pain in in the mid to lower abdomen in the middle- been having frequent bad reflux for last 2 months- taking tums a lot. Patient states she mostly has constipation with diarrhea maybe once a week

## 2020-05-25 NOTE — Progress Notes (Signed)
Left voicemail. Has appointment with surgeon 11/18. Her last message states she has had "bad reflux symptoms" for the past 2 months. Will send in PPI. Advised patient to call office back next week.

## 2020-05-25 NOTE — Telephone Encounter (Signed)
Left voicemail. See note. PPI ordered.

## 2020-05-31 ENCOUNTER — Encounter: Payer: Self-pay | Admitting: General Surgery

## 2020-05-31 ENCOUNTER — Other Ambulatory Visit: Payer: Self-pay

## 2020-05-31 ENCOUNTER — Ambulatory Visit: Payer: 59 | Admitting: General Surgery

## 2020-05-31 VITALS — BP 101/72 | HR 77 | Temp 98.4°F | Resp 16 | Ht 65.0 in | Wt 214.0 lb

## 2020-05-31 DIAGNOSIS — K432 Incisional hernia without obstruction or gangrene: Secondary | ICD-10-CM | POA: Diagnosis not present

## 2020-05-31 NOTE — Progress Notes (Signed)
[facility] History and Physical  Reason for Referral: Hernia  Referring Physician: Nilda Simmer, NP   Chief Complaint    New Patient (Initial Visit)      Alicia Ross is a 44 y.o. female.  HPI: Alicia Ross is a 44 yo who comes in with increased pressure and a bulge for the past 3 months. She says that she has discomfort in her midline area and that she has lost weight over the last several years (27 lbs). She has some associated reflux symptoms for the same period of time.  She denies taking any NSAIDs.  She was evaluated at her PCP and found to have a hernia.  She denies any obstructive type symptoms.   Past Medical History:  Diagnosis Date  . GERD (gastroesophageal reflux disease)   . Headache     Past Surgical History:  Procedure Laterality Date  . CESAREAN SECTION  01/ 2008  . KNEE ARTHROSCOPY Left 2006  . LAPAROSCOPIC TUBAL LIGATION Bilateral 08/25/2014   Procedure: LAPAROSCOPIC TUBAL LIGATION with Filshie Clips;  Surgeon: [name], MD;  Location: [place];  Service: Gynecology;  Laterality: Bilateral;  . [place] EXTRACTION  1995  . WRIST GANGLION EXCISION Left 1994  & 2004   in 2004  also removed spurs    Family History  Problem Relation Age of Onset  . Cancer Other        breast  . Hyperlipidemia Other   . Breast cancer Paternal Aunt 76  . Breast cancer Paternal Grandmother 64    Social History   Tobacco Use  . Smoking status: Never Smoker  . Smokeless tobacco: Never Used  Substance Use Topics  . Alcohol use: No  . Drug use: No    Medications: I have reviewed the patient's current medications. Allergies as of 05/31/2020      Reactions   Codeine Other (See Comments)   GI UPSET      Medication List       Accurate as of May 31, 2020  2:40 PM. If you have any questions, ask your nurse or doctor.        STOP taking these medications   ibuprofen 200 MG tablet Commonly known as: ADVIL Stopped  by: Alicia Cagey, MD   pantoprazole 40 MG tablet Commonly known as: PROTONIX Stopped by: Alicia Cagey, MD     TAKE these medications   loratadine 10 MG tablet Commonly known as: CLARITIN Take 10 mg by mouth daily.   Nasacort Allergy 24HR 55 MCG/ACT Aero nasal inhaler Generic drug: triamcinolone Nasacort 55 mcg nasal spray aerosol  Spray 2 sprays every day by intranasal route.   Vitamin D (Ergocalciferol) 1.25 MG (50000 UNIT) Caps capsule Commonly known as: DRISDOL Take 1 capsule (50,000 Units total) by mouth every 7 (seven) days.        ROS:  A comprehensive review of systems was negative except for: Gastrointestinal: positive for abdominal pain, nausea and reflux symptoms Musculoskeletal: positive for back pain, neck pain and joint pain Neurological: positive for dizziness and headaches  Blood pressure 101/72, pulse 77, temperature 98.4 F (36.9 C), temperature source Oral, resp. rate 16, height 5\' 5"  (1.651 m), weight 214 lb (97.1 kg), SpO2 96 %. Physical Exam Vitals reviewed.  Constitutional:      Appearance: Normal appearance.  HENT:     Head: Normocephalic.     Nose: Nose normal.     Mouth/Throat:     Mouth: Mucous membranes  are moist.  Eyes:     Extraocular Movements: Extraocular movements intact.  Cardiovascular:     Rate and Rhythm: Normal rate and regular rhythm.  Pulmonary:     Effort: Pulmonary effort is normal.     Breath sounds: Normal breath sounds.  Abdominal:     General: There is no distension.     Palpations: Abdomen is soft.     Tenderness: There is abdominal tenderness.     Hernia: A hernia is present. Hernia is present in the umbilical area.     Comments: Small defect about 1cm  Musculoskeletal:        General: No swelling. Normal range of motion.     Cervical back: Normal range of motion.  Skin:    General: Skin is warm and dry.  Neurological:     General: No focal deficit present.     Mental Status: She is alert and  oriented to person, place, and time.  Psychiatric:        Mood and Affect: Mood normal.        Behavior: Behavior normal.        Thought Content: Thought content normal.        Judgment: Judgment normal.     Results: None  Assessment & Plan:  Alicia Ross is a 44 y.o. female with a small incisional hernia just above her umbilicus likely from her prior tubal ligation. She is having discomfort. We discussed that the reflux would not likely be associated. Discussed repair with mesh and risk of bleeding, infection, injury to bowel, recurrence.   -Discussed preop COVID testing  -Discussed post operative lifting restrictions   All questions were answered to the satisfaction of the patient.    Alicia Ross 05/31/2020, 2:40 PM

## 2020-05-31 NOTE — Patient Instructions (Signed)
Ventral Hernia  A ventral hernia is a bulge of tissue from inside the abdomen that pushes through a weak area of the muscles that form the front wall of the abdomen. The tissues inside the abdomen are inside a sac (peritoneum). These tissues include the small intestine, large intestine, and the fatty tissue that covers the intestines (omentum). Sometimes, the bulge that forms a hernia contains intestines. Other hernias contain only fat. Ventral hernias do not go away without surgical treatment. There are several types of ventral hernias. You may have:  A hernia at an incision site from previous abdominal surgery (incisional hernia).  A hernia just above the belly button (epigastric hernia), or at the belly button (umbilical hernia). These types of hernias can develop from heavy lifting or straining.  A hernia that comes and goes (reducible hernia). It may be visible only when you lift or strain. This type of hernia can be pushed back into the abdomen (reduced).  A hernia that traps abdominal tissue inside the hernia (incarcerated hernia). This type of hernia does not reduce.  A hernia that cuts off blood flow to the tissues inside the hernia (strangulated hernia). The tissues can start to die if this happens. This is a very painful bulge that cannot be reduced. A strangulated hernia is a medical emergency. What are the causes? This condition is caused by abdominal tissue putting pressure on an area of weakness in the abdominal muscles. What increases the risk? The following factors may make you more likely to develop this condition:  Being female.  Being 60 or older.  Being overweight or obese.  Having had previous abdominal surgery, especially if there was an infection after surgery.  Having had an injury to the abdominal wall.  Having had several pregnancies.  Having a buildup of fluid inside the abdomen (ascites). What are the signs or symptoms? The only symptom of a ventral hernia  may be a painless bulge in the abdomen. A reducible hernia may be visible only when you strain, cough, or lift. Other symptoms may include:  Dull pain.  A feeling of pressure. Signs and symptoms of a strangulated hernia may include:  Increasing pain.  Nausea and vomiting.  Pain when pressing on the hernia.  The skin over the hernia turning red or purple.  Constipation.  Blood in the stool (feces). How is this diagnosed? This condition may be diagnosed based on:  Your symptoms.  Your medical history.  A physical exam. You may be asked to cough or strain while standing. These actions increase the pressure inside your abdomen and force the hernia through the opening in your muscles. Your health care provider may try to reduce the hernia by pressing on it.  Imaging studies, such as an ultrasound or CT scan. How is this treated? This condition is treated with surgery. If you have a strangulated hernia, surgery is done as soon as possible. If your hernia is small and not incarcerated, you may be asked to lose some weight before surgery. Follow these instructions at home:  Follow instructions from your health care provider about eating or drinking restrictions.  If you are overweight, your health care provider may recommend that you increase your activity level and eat a healthier diet.  Do not lift anything that is heavier than 10 lb (4.5 kg).  Return to your normal activities as told by your health care provider. Ask your health care provider what activities are safe for you. You may need to avoid activities   that increase pressure on your hernia.  Take over-the-counter and prescription medicines only as told by your health care provider.  Keep all follow-up visits as told by your health care provider. This is important. Contact a health care provider if:  Your hernia gets larger.  Your hernia becomes painful. Get help right away if:  Your hernia becomes increasingly  painful.  You have pain along with any of the following: ? Changes in skin color in the area of the hernia. ? Nausea. ? Vomiting. ? Fever. Summary  A ventral hernia is a bulge of tissue from inside the abdomen that pushes through a weak area of the muscles that form the front wall of the abdomen.  This condition is treated with surgery, which may be urgent depending on your hernia.  Do not lift anything that is heavier than 10 lb (4.5 kg), and follow activity instructions from your health care provider. This information is not intended to replace advice given to you by your health care provider. Make sure you discuss any questions you have with your health care provider. Document Revised: 08/12/2017 Document Reviewed: 01/19/2017 Elsevier Patient Education  [place] Repair, Adult  Open hernia repair is a surgical procedure to fix a hernia. A hernia occurs when an internal organ or tissue pushes out through a weak spot in the abdominal wall muscles. Hernias commonly occur in the groin and around the navel. Most hernias tend to get worse over time. Often, surgery is done to prevent the hernia from becoming bigger, uncomfortable, or an emergency. Emergency surgery may be needed if abdominal contents get stuck in the opening (incarcerated hernia) or the blood supply gets cut off (strangulated hernia). In an open repair, an incision is made in the abdomen to perform the surgery. Tell a health care provider about:  Any allergies you have.  All medicines you are taking, including vitamins, herbs, eye drops, creams, and over-the-counter medicines.  Any problems you or family members have had with anesthetic medicines.  Any blood or bone disorders you have.  Any surgeries you have had.  Any medical conditions you have, including any recent cold or flu symptoms.  Whether you are pregnant or may be pregnant. What are the risks? Generally, this is a safe procedure.  However, problems may occur, including:  Long-lasting (chronic) pain.  Bleeding.  Infection.  Damage to the testicle. This can cause shrinking or swelling.  Damage to the bladder, blood vessels, intestine, or nerves near the hernia.  Trouble passing urine.  Allergic reactions to medicines.  Return of the hernia. Medicines  Ask your health care provider about: ? Changing or stopping your regular medicines. This is especially important if you are taking diabetes medicines or blood thinners. ? Taking medicines such as aspirin and ibuprofen. These medicines can thin your blood. Do not take these medicines before your procedure if your health care provider instructs you not to.  You may be given antibiotic medicine to help prevent infection. General instructions  You may have blood tests or imaging studies.  Ask your health care provider how your surgical site will be marked or identified.  If you smoke, do not smoke for at least 2 weeks before your procedure or for as long as told by your health care provider.  Let your health care provider know if you develop a cold or any infection before your surgery.  Plan to have someone take you home from the hospital or clinic.  If you will be going home right after the procedure, plan to have someone with you for 24 hours. What happens during the procedure?  To reduce your risk of infection: ? Your health care team will wash or sanitize their hands. ? Your skin will be washed with soap. ? Hair may be removed from the surgical area.  An IV tube will be inserted into one of your veins.  You will be given one or more of the following: ? A medicine to help you relax (sedative). ? A medicine to numb the area (local anesthetic). ? A medicine to make you fall asleep (general anesthetic).  Your surgeon will make an incision over the hernia.  The tissues of the hernia will be moved back into place.  The edges of the hernia may be  stitched together.  The opening in the abdominal muscles will be closed with stitches (sutures). Or, your surgeon will place a mesh patch made of manmade (synthetic) material over the opening.  The incision will be closed.  A bandage (dressing) may be placed over the incision. The procedure may vary among health care providers and hospitals. What happens after the procedure?  Your blood pressure, heart rate, breathing rate, and blood oxygen level will be monitored until the medicines you were given have worn off.  You may be given medicine for pain.  Do not drive for 24 hours if you received a sedative. This information is not intended to replace advice given to you by your health care provider. Make sure you discuss any questions you have with your health care provider. Document Revised: 06/12/2017 Document Reviewed: 12/12/2015 Elsevier Patient Education  2020 [employer].

## 2020-06-01 NOTE — H&P (Signed)
[facility] History and Physical  Reason for Referral: Hernia  Referring Physician: Nilda Simmer, NP   Chief Complaint    New Patient (Initial Visit)      Alicia Ross is a 44 y.o. female.  HPI: Alicia Ross is a 44 yo who comes in with increased pressure and a bulge for the past 3 months. She says that she has discomfort in her midline area and that she has lost weight over the last several years (27 lbs). She has some associated reflux symptoms for the same period of time.  She denies taking any NSAIDs.  She was evaluated at her PCP and found to have a hernia.  She denies any obstructive type symptoms.   Past Medical History:  Diagnosis Date  . GERD (gastroesophageal reflux disease)   . Headache     Past Surgical History:  Procedure Laterality Date  . CESAREAN SECTION  01/ 2008  . KNEE ARTHROSCOPY Left 2006  . LAPAROSCOPIC TUBAL LIGATION Bilateral 08/25/2014   Procedure: LAPAROSCOPIC TUBAL LIGATION with Filshie Clips;  Surgeon: [name], MD;  Location: [place];  Service: Gynecology;  Laterality: Bilateral;  . [place] EXTRACTION  1995  . WRIST GANGLION EXCISION Left 1994  & 2004   in 2004  also removed spurs    Family History  Problem Relation Age of Onset  . Cancer Other        breast  . Hyperlipidemia Other   . Breast cancer Paternal Aunt 23  . Breast cancer Paternal Grandmother 17    Social History   Tobacco Use  . Smoking status: Never Smoker  . Smokeless tobacco: Never Used  Substance Use Topics  . Alcohol use: No  . Drug use: No    Medications: I have reviewed the patient's current medications. Allergies as of 05/31/2020      Reactions   Codeine Other (See Comments)   GI UPSET      Medication List       Accurate as of May 31, 2020  2:40 PM. If you have any questions, ask your nurse or doctor.        STOP taking these medications   ibuprofen 200 MG tablet Commonly known as: ADVIL Stopped  by: [name], MD   pantoprazole 40 MG tablet Commonly known as: PROTONIX Stopped by: [name], MD     TAKE these medications   loratadine 10 MG tablet Commonly known as: CLARITIN Take 10 mg by mouth daily.   Nasacort Allergy 24HR 55 MCG/ACT Aero nasal inhaler Generic drug: triamcinolone Nasacort 55 mcg nasal spray aerosol  Spray 2 sprays every day by intranasal route.   Vitamin D (Ergocalciferol) 1.25 MG (50000 UNIT) Caps capsule Commonly known as: DRISDOL Take 1 capsule (50,000 Units total) by mouth every 7 (seven) days.        ROS:  A comprehensive review of systems was negative except for: Gastrointestinal: positive for abdominal pain, nausea and reflux symptoms Musculoskeletal: positive for back pain, neck pain and joint pain Neurological: positive for dizziness and headaches  Blood pressure 101/72, pulse 77, temperature 98.4 F (36.9 C), temperature source Oral, resp. rate 16, height 5\' 5"  (1.651 m), weight 214 lb (97.1 kg), SpO2 96 %. Physical Exam Vitals reviewed.  Constitutional:      Appearance: Normal appearance.  HENT:     Head: Normocephalic.     Nose: Nose normal.     Mouth/Throat:     Mouth: Mucous membranes  are moist.  Eyes:     Extraocular Movements: Extraocular movements intact.  Cardiovascular:     Rate and Rhythm: Normal rate and regular rhythm.  Pulmonary:     Effort: Pulmonary effort is normal.     Breath sounds: Normal breath sounds.  Abdominal:     General: There is no distension.     Palpations: Abdomen is soft.     Tenderness: There is abdominal tenderness.     Hernia: A hernia is present. Hernia is present in the umbilical area.     Comments: Small defect about 1cm  Musculoskeletal:        General: No swelling. Normal range of motion.     Cervical back: Normal range of motion.  Skin:    General: Skin is warm and dry.  Neurological:     General: No focal deficit present.     Mental Status: She is alert and  oriented to person, place, and time.  Psychiatric:        Mood and Affect: Mood normal.        Behavior: Behavior normal.        Thought Content: Thought content normal.        Judgment: Judgment normal.     Results: None  Assessment & Plan:  [name] is a 44 y.o. female with a small incisional hernia just above her umbilicus likely from her prior tubal ligation. She is having discomfort. We discussed that the reflux would not likely be associated. Discussed repair with mesh and risk of bleeding, infection, injury to bowel, recurrence.   -Discussed preop COVID testing  -Discussed post operative lifting restrictions   All questions were answered to the satisfaction of the patient.    [name] 05/31/2020, 2:40 PM

## 2020-06-14 ENCOUNTER — Other Ambulatory Visit (HOSPITAL_COMMUNITY): Payer: 59

## 2020-06-14 ENCOUNTER — Encounter (HOSPITAL_COMMUNITY): Payer: 59

## 2020-06-15 DIAGNOSIS — K432 Incisional hernia without obstruction or gangrene: Secondary | ICD-10-CM | POA: Diagnosis present

## 2020-06-22 NOTE — Patient Instructions (Signed)
Alicia Ross  06/22/2020     @PREFPERIOPPHARMACY @   Your procedure is scheduled on  06/27/2020.  Report to [facility] at  [phone]  A.M.  Call this number if you have problems the morning of surgery:  [phone]   Remember:  Do not eat or drink after midnight.                         Take these medicines the morning of surgery with A SIP OF WATER  claritin    Do not wear jewelry, make-up or nail polish.  Do not wear lotions, powders, or perfumes. Please wear deodorant and brush your teeth.  Do not shave 48 hours prior to surgery.  Men may shave face and neck.  Do not bring valuables to the hospital.  [facility] is not responsible for any belongings or valuables.  Contacts, dentures or bridgework may not be worn into surgery.  Leave your suitcase in the car.  After surgery it may be brought to your room.  For patients admitted to the hospital, discharge time will be determined by your treatment team.  Patients discharged the day of surgery will not be allowed to drive home.   Name and phone number of your driver:   family Special instructions:  DO NOT smoke the morning of your procedure.  Please read over the following fact sheets that you were given. Anesthesia Post-op Instructions and Care and Recovery After Surgery       Open Hernia Repair, Adult, Care After These instructions give you information about caring for yourself after your procedure. Your doctor may also give you more specific instructions. If you have problems or questions, contact your doctor. Follow these instructions at home: Surgical cut (incision) care   Follow instructions from your doctor about how to take care of your surgical cut area. Make sure you: ? Wash your hands with soap and water before you change your bandage (dressing). If you cannot use soap and water, use hand sanitizer. ? Change your bandage as told by your doctor. ? Leave stitches (sutures), skin glue, or skin tape  (adhesive) strips in place. They may need to stay in place for 2 weeks or longer. If tape strips get loose and curl up, you may trim the loose edges. Do not remove tape strips completely unless your doctor says it is okay.  Check your surgical cut every day for signs of infection. Check for: ? More redness, swelling, or pain. ? More fluid or blood. ? Warmth. ? Pus or a bad smell. Activity  Do not drive or use heavy machinery while taking prescription pain medicine. Do not drive until your doctor says it is okay.  Until your doctor says it is okay: ? Do not lift anything that is heavier than 10 lb (4.5 kg). ? Do not play contact sports.  Return to your normal activities as told by your doctor. Ask your doctor what activities are safe. General instructions  To prevent or treat having a hard time pooping (constipation) while you are taking prescription pain medicine, your doctor may recommend that you: ? Drink enough fluid to keep your pee (urine) clear or pale yellow. ? Take over-the-counter or prescription medicines. ? Eat foods that are high in fiber, such as fresh fruits and vegetables, whole grains, and beans. ? Limit foods that are high in fat and processed sugars, such as fried and sweet foods.  Take over-the-counter and prescription medicines only as told by your doctor.  Do not take baths, swim, or use a hot tub until your doctor says it is okay.  Keep all follow-up visits as told by your doctor. This is important. Contact a doctor if:  You develop a rash.  You have more redness, swelling, or pain around your surgical cut.  You have more fluid or blood coming from your surgical cut.  Your surgical cut feels warm to the touch.  You have pus or a bad smell coming from your surgical cut.  You have a fever or chills.  You have blood in your poop (stool).  You have not pooped in 2-3 days.  Medicine does not help your pain. Get help right away if:  You have chest  pain or you are short of breath.  You feel light-headed.  You feel weak and dizzy (feel faint).  You have very bad pain.  You throw up (vomit) and your pain is worse. This information is not intended to replace advice given to you by your health care provider. Make sure you discuss any questions you have with your health care provider. Document Revised: 10/22/2018 Document Reviewed: 12/12/2015 Elsevier Patient Education  2020 [place] Anesthesia, Adult, Care After This sheet gives you information about how to care for yourself after your procedure. Your health care provider may also give you more specific instructions. If you have problems or questions, contact your health care provider. What can I expect after the procedure? After the procedure, the following side effects are common:  Pain or discomfort at the IV site.  Nausea.  Vomiting.  Sore throat.  Trouble concentrating.  Feeling cold or chills.  Weak or tired.  Sleepiness and fatigue.  Soreness and body aches. These side effects can affect parts of the body that were not involved in surgery. Follow these instructions at home:  For at least 24 hours after the procedure:  Have a responsible adult stay with you. It is important to have someone help care for you until you are awake and alert.  Rest as needed.  Do not: ? Participate in activities in which you could fall or become injured. ? Drive. ? Use heavy machinery. ? Drink alcohol. ? Take sleeping pills or medicines that cause drowsiness. ? Make important decisions or sign legal documents. ? Take care of children on your own. Eating and drinking  Follow any instructions from your health care provider about eating or drinking restrictions.  When you feel hungry, start by eating small amounts of foods that are soft and easy to digest (bland), such as toast. Gradually return to your regular diet.  Drink enough fluid to keep your urine pale  yellow.  If you vomit, rehydrate by drinking water, juice, or clear broth. General instructions  If you have sleep apnea, surgery and certain medicines can increase your risk for breathing problems. Follow instructions from your health care provider about wearing your sleep device: ? Anytime you are sleeping, including during daytime naps. ? While taking prescription pain medicines, sleeping medicines, or medicines that make you drowsy.  Return to your normal activities as told by your health care provider. Ask your health care provider what activities are safe for you.  Take over-the-counter and prescription medicines only as told by your health care provider.  If you smoke, do not smoke without supervision.  Keep all follow-up visits as told by your health care provider. This is important. Contact  a health care provider if:  You have nausea or vomiting that does not get better with medicine.  You cannot eat or drink without vomiting.  You have pain that does not get better with medicine.  You are unable to pass urine.  You develop a skin rash.  You have a fever.  You have redness around your IV site that gets worse. Get help right away if:  You have difficulty breathing.  You have chest pain.  You have blood in your urine or stool, or you vomit blood. Summary  After the procedure, it is common to have a sore throat or nausea. It is also common to feel tired.  Have a responsible adult stay with you for the first 24 hours after general anesthesia. It is important to have someone help care for you until you are awake and alert.  When you feel hungry, start by eating small amounts of foods that are soft and easy to digest (bland), such as toast. Gradually return to your regular diet.  Drink enough fluid to keep your urine pale yellow.  Return to your normal activities as told by your health care provider. Ask your health care provider what activities are safe for  you. This information is not intended to replace advice given to you by your health care provider. Make sure you discuss any questions you have with your health care provider. Document Revised: 07/03/2017 Document Reviewed: 02/13/2017 Elsevier Patient Education  [place]. How to Use Chlorhexidine for Bathing Chlorhexidine gluconate (CHG) is a germ-killing (antiseptic) solution that is used to clean the skin. It can get rid of the bacteria that normally live on the skin and can keep them away for about 24 hours. To clean your skin with CHG, you may be given:  A CHG solution to use in the shower or as part of a sponge bath.  A prepackaged cloth that contains CHG. Cleaning your skin with CHG may help lower the risk for infection:  While you are staying in the intensive care unit of the hospital.  If you have a vascular access, such as a central line, to provide short-term or long-term access to your veins.  If you have a catheter to drain urine from your bladder.  If you are on a ventilator. A ventilator is a machine that helps you breathe by moving air in and out of your lungs.  After surgery. What are the risks? Risks of using CHG include:  A skin reaction.  Hearing loss, if CHG gets in your ears.  Eye injury, if CHG gets in your eyes and is not rinsed out.  The CHG product catching fire. Make sure that you avoid smoking and flames after applying CHG to your skin. Do not use CHG:  If you have a chlorhexidine allergy or have previously reacted to chlorhexidine.  On babies younger than 26 months of age. How to use CHG solution  Use CHG only as told by your health care provider, and follow the instructions on the label.  Use the full amount of CHG as directed. Usually, this is one bottle. During a shower Follow these steps when using CHG solution during a shower (unless your health care provider gives you different instructions): 1. Start the shower. 2. Use your  normal soap and shampoo to wash your face and hair. 3. Turn off the shower or move out of the shower stream. 4. Pour the CHG onto a clean washcloth. Do not use any type  of brush or rough-edged sponge. 5. Starting at your neck, lather your body down to your toes. Make sure you follow these instructions: ? If you will be having surgery, pay special attention to the part of your body where you will be having surgery. Scrub this area for at least 1 minute. ? Do not use CHG on your head or face. If the solution gets into your ears or eyes, rinse them well with water. ? Avoid your genital area. ? Avoid any areas of skin that have broken skin, cuts, or scrapes. ? Scrub your back and under your arms. Make sure to wash skin folds. 6. Let the lather sit on your skin for 1-2 minutes or as long as told by your health care provider. 7. Thoroughly rinse your entire body in the shower. Make sure that all body creases and crevices are rinsed well. 8. Dry off with a clean towel. Do not put any substances on your body afterward--such as powder, lotion, or perfume--unless you are told to do so by your health care provider. Only use lotions that are recommended by the manufacturer. 9. Put on clean clothes or pajamas. 10. If it is the night before your surgery, sleep in clean sheets.  During a sponge bath Follow these steps when using CHG solution during a sponge bath (unless your health care provider gives you different instructions): 1. Use your normal soap and shampoo to wash your face and hair. 2. Pour the CHG onto a clean washcloth. 3. Starting at your neck, lather your body down to your toes. Make sure you follow these instructions: ? If you will be having surgery, pay special attention to the part of your body where you will be having surgery. Scrub this area for at least 1 minute. ? Do not use CHG on your head or face. If the solution gets into your ears or eyes, rinse them well with water. ? Avoid your  genital area. ? Avoid any areas of skin that have broken skin, cuts, or scrapes. ? Scrub your back and under your arms. Make sure to wash skin folds. 4. Let the lather sit on your skin for 1-2 minutes or as long as told by your health care provider. 5. Using a different clean, wet washcloth, thoroughly rinse your entire body. Make sure that all body creases and crevices are rinsed well. 6. Dry off with a clean towel. Do not put any substances on your body afterward--such as powder, lotion, or perfume--unless you are told to do so by your health care provider. Only use lotions that are recommended by the manufacturer. 7. Put on clean clothes or pajamas. 8. If it is the night before your surgery, sleep in clean sheets. How to use CHG prepackaged cloths  Only use CHG cloths as told by your health care provider, and follow the instructions on the label.  Use the CHG cloth on clean, dry skin.  Do not use the CHG cloth on your head or face unless your health care provider tells you to.  When washing with the CHG cloth: ? Avoid your genital area. ? Avoid any areas of skin that have broken skin, cuts, or scrapes. Before surgery Follow these steps when using a CHG cloth to clean before surgery (unless your health care provider gives you different instructions): 1. Using the CHG cloth, vigorously scrub the part of your body where you will be having surgery. Scrub using a back-and-forth motion for 3 minutes. The area on your body  should be completely wet with CHG when you are done scrubbing. 2. Do not rinse. Discard the cloth and let the area air-dry. Do not put any substances on the area afterward, such as powder, lotion, or perfume. 3. Put on clean clothes or pajamas. 4. If it is the night before your surgery, sleep in clean sheets.  For general bathing Follow these steps when using CHG cloths for general bathing (unless your health care provider gives you different instructions). 1. Use a  separate CHG cloth for each area of your body. Make sure you wash between any folds of skin and between your fingers and toes. Wash your body in the following order, switching to a new cloth after each step: ? The front of your neck, shoulders, and chest. ? Both of your arms, under your arms, and your hands. ? Your stomach and groin area, avoiding the genitals. ? Your right leg and foot. ? Your left leg and foot. ? The back of your neck, your back, and your buttocks. 2. Do not rinse. Discard the cloth and let the area air-dry. Do not put any substances on your body afterward--such as powder, lotion, or perfume--unless you are told to do so by your health care provider. Only use lotions that are recommended by the manufacturer. 3. Put on clean clothes or pajamas. Contact a health care provider if:  Your skin gets irritated after scrubbing.  You have questions about using your solution or cloth. Get help right away if:  Your eyes become very red or swollen.  Your eyes itch badly.  Your skin itches badly and is red or swollen.  Your hearing changes.  You have trouble seeing.  You have swelling or tingling in your mouth or throat.  You have trouble breathing.  You swallow any chlorhexidine. Summary  Chlorhexidine gluconate (CHG) is a germ-killing (antiseptic) solution that is used to clean the skin. Cleaning your skin with CHG may help to lower your risk for infection.  You may be given CHG to use for bathing. It may be in a bottle or in a prepackaged cloth to use on your skin. Carefully follow your health care provider's instructions and the instructions on the product label.  Do not use CHG if you have a chlorhexidine allergy.  Contact your health care provider if your skin gets irritated after scrubbing. This information is not intended to replace advice given to you by your health care provider. Make sure you discuss any questions you have with your health care  provider. Document Revised: 09/16/2018 Document Reviewed: 05/28/2017 Elsevier Patient Education  [place].

## 2020-06-26 ENCOUNTER — Encounter (HOSPITAL_COMMUNITY): Payer: Self-pay

## 2020-06-26 ENCOUNTER — Other Ambulatory Visit: Payer: Self-pay

## 2020-06-26 ENCOUNTER — Encounter (HOSPITAL_COMMUNITY)
Admission: RE | Admit: 2020-06-26 | Discharge: 2020-06-26 | Disposition: A | Discharge status: Home / Self Care | Payer: 59 | Source: Ambulatory Visit | Attending: General Surgery | Admitting: General Surgery

## 2020-06-26 ENCOUNTER — Other Ambulatory Visit (HOSPITAL_COMMUNITY)
Admission: RE | Admit: 2020-06-26 | Discharge: 2020-06-26 | Disposition: A | Discharge status: Home / Self Care | Payer: 59 | Source: Ambulatory Visit | Attending: General Surgery | Admitting: General Surgery

## 2020-06-26 DIAGNOSIS — Z20822 Contact with and (suspected) exposure to covid-19: Secondary | ICD-10-CM | POA: Insufficient documentation

## 2020-06-26 DIAGNOSIS — Z01812 Encounter for preprocedural laboratory examination: Secondary | ICD-10-CM | POA: Diagnosis not present

## 2020-06-26 LAB — PREGNANCY, URINE: Preg Test, Ur: NEGATIVE

## 2020-06-26 LAB — SARS CORONAVIRUS 2 (TAT 6-24 HRS): SARS Coronavirus 2: NEGATIVE

## 2020-06-27 ENCOUNTER — Encounter (HOSPITAL_COMMUNITY): Payer: Self-pay | Admitting: General Surgery

## 2020-06-27 ENCOUNTER — Ambulatory Visit (HOSPITAL_COMMUNITY)
Admission: RE | Admit: 2020-06-27 | Discharge: 2020-06-27 | Disposition: A | Discharge status: Home / Self Care | Payer: 59 | Attending: General Surgery | Admitting: General Surgery

## 2020-06-27 ENCOUNTER — Encounter (HOSPITAL_COMMUNITY)
Admission: RE | Disposition: A | Discharge status: Home / Self Care | Payer: Self-pay | Source: Home / Self Care | Attending: General Surgery

## 2020-06-27 ENCOUNTER — Ambulatory Visit (HOSPITAL_COMMUNITY): Payer: 59 | Admitting: Anesthesiology

## 2020-06-27 DIAGNOSIS — Z885 Allergy status to narcotic agent status: Secondary | ICD-10-CM | POA: Insufficient documentation

## 2020-06-27 DIAGNOSIS — K430 Incisional hernia with obstruction, without gangrene: Secondary | ICD-10-CM | POA: Diagnosis not present

## 2020-06-27 DIAGNOSIS — K432 Incisional hernia without obstruction or gangrene: Secondary | ICD-10-CM | POA: Diagnosis not present

## 2020-06-27 HISTORY — PX: INCISIONAL HERNIA REPAIR: SHX193

## 2020-06-27 SURGERY — REPAIR, HERNIA, INCISIONAL
Anesthesia: General | Site: Abdomen

## 2020-06-27 MED ORDER — SUGAMMADEX SODIUM 500 MG/5ML IV SOLN
INTRAVENOUS | Status: DC | PRN
  Administered 2020-06-27: 200 mg via INTRAVENOUS

## 2020-06-27 MED ORDER — CHLORHEXIDINE GLUCONATE 0.12 % MT SOLN
15.0000 mL | Freq: Once | OROMUCOSAL | Status: AC
  Administered 2020-06-27: 15 mL via OROMUCOSAL

## 2020-06-27 MED ORDER — BUPIVACAINE LIPOSOME 1.3 % IJ SUSP
INTRAMUSCULAR | Status: DC | PRN
  Administered 2020-06-27: 10 mL

## 2020-06-27 MED ORDER — 0.9 % SODIUM CHLORIDE (POUR BTL) OPTIME
TOPICAL | Status: DC | PRN
  Administered 2020-06-27: 1000 mL

## 2020-06-27 MED ORDER — BUPIVACAINE LIPOSOME 1.3 % IJ SUSP
INTRAMUSCULAR | Status: AC
  Filled 2020-06-27: qty 20

## 2020-06-27 MED ORDER — OXYCODONE HCL 5 MG PO TABS: 5.0000 mg | ORAL_TABLET | ORAL | 0 refills | Status: DC | PRN

## 2020-06-27 MED ORDER — ONDANSETRON HCL 4 MG/2ML IJ SOLN
INTRAMUSCULAR | Status: AC
  Filled 2020-06-27: qty 2

## 2020-06-27 MED ORDER — ROCURONIUM BROMIDE 100 MG/10ML IV SOLN
INTRAVENOUS | Status: DC | PRN
  Administered 2020-06-27: 40 mg via INTRAVENOUS

## 2020-06-27 MED ORDER — SUCCINYLCHOLINE CHLORIDE 200 MG/10ML IV SOSY
PREFILLED_SYRINGE | INTRAVENOUS | Status: AC
  Filled 2020-06-27: qty 10

## 2020-06-27 MED ORDER — LACTATED RINGERS IV SOLN: INTRAVENOUS | Status: DC

## 2020-06-27 MED ORDER — PROPOFOL 10 MG/ML IV BOLUS
INTRAVENOUS | Status: AC
  Filled 2020-06-27: qty 40

## 2020-06-27 MED ORDER — DOCUSATE SODIUM 100 MG PO CAPS: 100.0000 mg | ORAL_CAPSULE | Freq: Two times a day (BID) | ORAL | 2 refills | Status: DC | PRN

## 2020-06-27 MED ORDER — FENTANYL CITRATE (PF) 100 MCG/2ML IJ SOLN: 25.0000 ug | INTRAMUSCULAR | Status: DC | PRN

## 2020-06-27 MED ORDER — DEXAMETHASONE SODIUM PHOSPHATE 10 MG/ML IJ SOLN
INTRAMUSCULAR | Status: DC | PRN
  Administered 2020-06-27: 10 mg via INTRAVENOUS

## 2020-06-27 MED ORDER — CHLORHEXIDINE GLUCONATE CLOTH 2 % EX PADS: 6.0000 | MEDICATED_PAD | Freq: Once | CUTANEOUS | Status: DC

## 2020-06-27 MED ORDER — FENTANYL CITRATE (PF) 250 MCG/5ML IJ SOLN
INTRAMUSCULAR | Status: AC
  Filled 2020-06-27: qty 5

## 2020-06-27 MED ORDER — ONDANSETRON HCL 4 MG/2ML IJ SOLN: 4.0000 mg | Freq: Once | INTRAMUSCULAR | Status: DC | PRN

## 2020-06-27 MED ORDER — DEXAMETHASONE SODIUM PHOSPHATE 10 MG/ML IJ SOLN
INTRAMUSCULAR | Status: AC
  Filled 2020-06-27: qty 1

## 2020-06-27 MED ORDER — CEFAZOLIN SODIUM-DEXTROSE 2-4 GM/100ML-% IV SOLN
INTRAVENOUS | Status: AC
  Filled 2020-06-27: qty 100

## 2020-06-27 MED ORDER — PROPOFOL 10 MG/ML IV BOLUS
INTRAVENOUS | Status: DC | PRN
  Administered 2020-06-27: 180 mg via INTRAVENOUS
  Administered 2020-06-27: 20 mg via INTRAVENOUS

## 2020-06-27 MED ORDER — ONDANSETRON HCL 4 MG PO TABS: 4.0000 mg | ORAL_TABLET | Freq: Three times a day (TID) | ORAL | 1 refills | Status: DC | PRN

## 2020-06-27 MED ORDER — ONDANSETRON HCL 4 MG/2ML IJ SOLN
INTRAMUSCULAR | Status: DC | PRN
  Administered 2020-06-27: 4 mg via INTRAVENOUS

## 2020-06-27 MED ORDER — CEFAZOLIN SODIUM-DEXTROSE 2-4 GM/100ML-% IV SOLN
2.0000 g | INTRAVENOUS | Status: AC
  Administered 2020-06-27: 2 g via INTRAVENOUS

## 2020-06-27 MED ORDER — SUCCINYLCHOLINE CHLORIDE 20 MG/ML IJ SOLN
INTRAMUSCULAR | Status: DC | PRN
  Administered 2020-06-27: 120 mg via INTRAVENOUS

## 2020-06-27 MED ORDER — FENTANYL CITRATE (PF) 100 MCG/2ML IJ SOLN
INTRAMUSCULAR | Status: DC | PRN
  Administered 2020-06-27: 50 ug via INTRAVENOUS
  Administered 2020-06-27: 100 ug via INTRAVENOUS

## 2020-06-27 MED ORDER — ROCURONIUM BROMIDE 10 MG/ML (PF) SYRINGE
PREFILLED_SYRINGE | INTRAVENOUS | Status: AC
  Filled 2020-06-27: qty 10

## 2020-06-27 MED ORDER — ORAL CARE MOUTH RINSE: 15.0000 mL | Freq: Once | OROMUCOSAL | Status: AC

## 2020-06-27 MED ORDER — ONDANSETRON HCL 4 MG/2ML IJ SOLN: INTRAMUSCULAR | Status: DC | PRN

## 2020-06-27 SURGICAL SUPPLY — 50 items
ADH SKN CLS APL DERMABOND .7 (GAUZE/BANDAGES/DRESSINGS) ×1
APL PRP STRL LF DISP 70% ISPRP (MISCELLANEOUS) ×1
BLADE SURG SZ11 CARB STEEL (BLADE) ×2 IMPLANT
CHLORAPREP W/TINT 26 (MISCELLANEOUS) ×2 IMPLANT
CLOTH BEACON ORANGE TIMEOUT ST (SAFETY) ×2 IMPLANT
COVER LIGHT HANDLE STERIS (MISCELLANEOUS) ×4 IMPLANT
COVER WAND RF STERILE (DRAPES) ×2 IMPLANT
DECANTER SPIKE VIAL GLASS SM (MISCELLANEOUS) IMPLANT
DERMABOND ADVANCED (GAUZE/BANDAGES/DRESSINGS) ×1
DERMABOND ADVANCED .7 DNX12 (GAUZE/BANDAGES/DRESSINGS) ×1 IMPLANT
ELECT REM PT RETURN 9FT ADLT (ELECTROSURGICAL) ×2
ELECTRODE REM PT RTRN 9FT ADLT (ELECTROSURGICAL) ×1 IMPLANT
GLOVE BIO SURGEON STRL SZ 6.5 (GLOVE) ×2 IMPLANT
GLOVE BIO SURGEON STRL SZ7 (GLOVE) ×2 IMPLANT
GLOVE BIOGEL PI IND STRL 6.5 (GLOVE) ×1 IMPLANT
GLOVE BIOGEL PI IND STRL 7.0 (GLOVE) ×1 IMPLANT
GLOVE BIOGEL PI INDICATOR 6.5 (GLOVE) ×1
GLOVE BIOGEL PI INDICATOR 7.0 (GLOVE) ×1
GLOVE ECLIPSE 6.5 STRL STRAW (GLOVE) ×2 IMPLANT
GLOVE SURG SS PI 7.5 STRL IVOR (GLOVE) ×2 IMPLANT
GOWN STRL REUS W/TWL LRG LVL3 (GOWN DISPOSABLE) ×6 IMPLANT
KIT TURNOVER KIT A (KITS) ×2 IMPLANT
MANIFOLD NEPTUNE II (INSTRUMENTS) ×2 IMPLANT
MESH VENTRALEX ST 1-7/10 CRC S (Mesh General) ×2 IMPLANT
NEEDLE HYPO 21X1.5 SAFETY (NEEDLE) ×2 IMPLANT
NS IRRIG 1000ML POUR BTL (IV SOLUTION) ×2 IMPLANT
PACK MINOR (CUSTOM PROCEDURE TRAY) ×2 IMPLANT
PAD ARMBOARD 7.5X6 YLW CONV (MISCELLANEOUS) ×2 IMPLANT
SET BASIN LINEN APH (SET/KITS/TRAYS/PACK) ×2 IMPLANT
SPONGE GAUZE 2X2 8PLY STRL LF (GAUZE/BANDAGES/DRESSINGS) ×2 IMPLANT
STAPLER VISISTAT (STAPLE) IMPLANT
SUT ETHIBOND 0 MO6 C/R (SUTURE) ×2 IMPLANT
SUT MNCRL AB 4-0 PS2 18 (SUTURE) ×4 IMPLANT
SUT NOVA NAB GS-21 1 T12 (SUTURE) IMPLANT
SUT NOVA NAB GS-22 2 2-0 T-19 (SUTURE) IMPLANT
SUT NOVA NAB GS-26 0 60 (SUTURE) IMPLANT
SUT PROLENE 0 CT 1 CR/8 (SUTURE) ×2 IMPLANT
SUT PROLENE 2 0 SH 30 (SUTURE) IMPLANT
SUT SILK 2 0 (SUTURE)
SUT SILK 2-0 18XBRD TIE 12 (SUTURE) IMPLANT
SUT SILK 3 0 (SUTURE)
SUT SILK 3-0 18XBRD TIE 12 (SUTURE) IMPLANT
SUT VIC AB 2-0 CT1 27 (SUTURE) ×2
SUT VIC AB 2-0 CT1 TAPERPNT 27 (SUTURE) ×1 IMPLANT
SUT VIC AB 3-0 SH 27 (SUTURE) ×2
SUT VIC AB 3-0 SH 27X BRD (SUTURE) ×1 IMPLANT
SUT VIC AB 4-0 PS2 27 (SUTURE) IMPLANT
SUT VICRYL AB 2 0 TIES (SUTURE) IMPLANT
SYR 20ML LL LF (SYRINGE) ×2 IMPLANT
TAPE PAPER 2X10 WHT MICROPORE (GAUZE/BANDAGES/DRESSINGS) ×2 IMPLANT

## 2020-06-27 NOTE — Op Note (Signed)
[facility] Operative Note  06/27/20  Preoperative Diagnosis: Supraumbilical incisional hernia    Postoperative Diagnosis: Same   Procedure(s) Performed:  Incisional hernia repair with mesh (4.3 cm Ventralex Patch)    Surgeon: Lanell Matar. Constance Haw, MD   Assistants: No qualified resident was available    Anesthesia: General endotracheal   Anesthesiologist: Louann Sjogren, MD    Specimens:  None    Estimated Blood Loss: Minimal   Blood Replacement: None    Complications: None   Wound Class: Clean   Operative Indications:  Ms. Capurro is a 44 yo with a supraumbilical hernia that has been causing her pain. She had had prior laparoscopic surgeries and this is around the area of those ports. We discussed repair and the risk of bleeding, infection, injury to other organs, recurrence and use of mesh.   Findings: 1 cm defect supraumbilical with omentum    Procedure: The patient was taken to the operating room and placed supine. General endotracheal anesthesia was induced. Intravenous antibiotics were  administered per protocol.  The abdomen was prepared and draped in the usual sterile fashion.   The supraumbilical hernia was noted to be non reducible. An incision was made above the umbilicus, and carried down through the subcutaneous tissue with electrocautery.  Dissection was performed down to the level of the fascia, exposing the hernia sac.  The fascia defect was about 1 cm. The hernia sac was opened with care, and excess hernia sac was resected with electrocautery.  A finger was ran on the underlying peritoneum and this was clear.  A 4.3 cm Ventralex St Hernia Patch was placed and secured with 0 Ethibond sutures ensuring that it was against the peritoneal cavity.  The hernia defect was then closed with 0 Ethibond suture in an interrupted fashion over the patch.  The umbilicus was tacked to the fascia with a 3-0 Vicryl suture.   Hemostasis was confirmed. The skin was  closed with a running 4-0 Monocryl suture and dermabond.  After the dermabond dried a 2X2 and tegaderm were placed over the umbilicus to act as a pressure dressing.    All counts were correct at the end of the case. The patient was awakened from anesthesia and extubated without complication.  The patient went to the PACU in stable condition.  Curlene Labrum, MD [facility] [address] [place], [place] [id number] [phone] (office)

## 2020-06-27 NOTE — Anesthesia Procedure Notes (Signed)
Procedure Name: Intubation Date/Time: 06/27/2020 7:43 AM Performed by: Vista Deck, CRNA Pre-anesthesia Checklist: Patient identified, Patient being monitored, Timeout performed, Emergency Drugs available and Suction available Patient Re-evaluated:Patient Re-evaluated prior to induction Oxygen Delivery Method: Circle System Utilized Preoxygenation: Pre-oxygenation with 100% oxygen Induction Type: IV induction Laryngoscope Size: Mac and 3 Grade View: Grade II Tube type: Oral Tube size: 7.0 mm Number of attempts: 1 Airway Equipment and Method: stylet and Oral airway Placement Confirmation: ETT inserted through vocal cords under direct vision,  positive ETCO2 and breath sounds checked- equal and bilateral Secured at: 22 cm Tube secured with: Tape Dental Injury: Teeth and Oropharynx as per pre-operative assessment

## 2020-06-27 NOTE — Discharge Instructions (Signed)
Discharge Instructions Hernia:  Common Complaints: Pain at the incision site is common. This will improve with time. Take your pain medications as described below. Some nausea is common and poor appetite. The main goal is to stay hydrated the first few days after surgery.   Diet/ Activity: Diet as tolerated. You may not have an appetite, but it is important to stay hydrated. Drink 64 ounces of water a day. Your appetite will return with time.  Remove the small clear dressing and gauze after two days (48 hours). Trim the gauze off the glue that is underneath if the gauze is stuck to the glue. Shower per your regular routine daily.  Do not take hot showers. Take warm showers that are less than 10 minutes to prevent glue from peeling off. Rest and listen to your body, but do not remain in bed all day.Walk everyday for at least 15-20 minutes.  Deep cough and move around every 1-2 hours in the first few days after surgery. Do not pick at the dermabond glue on your incision sites.  This glue film will remain in place for 1-2 weeks and will start to peel off. Do not place lotions or balms on your incision unless instructed to specifically by Dr. Constance Haw. Do not lift > 10 lbs, perform excessive bending, pushing, pulling, squatting for 6-8 weeks after surgery. Where your abdominal binder with activity as much as possible. The activity restrictions and the abdominal binder are to prevent hernia formation at your incision while you are healing.   Pain Expectations and Narcotics: -After surgery you will have pain associated with your incisions and this is normal. The pain is muscular and nerve pain, and will get better with time. -You are encouraged and expected to take non narcotic medications like tylenol and ibuprofen (when able) to treat pain as multiple modalities can aid with pain treatment. -Narcotics are only used when pain is severe or there is breakthrough pain. -You are not expected to have a  pain score of 0 after surgery, as we cannot prevent pain. A pain score of 3-4 that allows you to be functional, move, walk, and tolerate some activity is the goal. The pain will continue to improve over the days after surgery and is dependent on your surgery. -Due to [place] law, we are only able to give a certain amount of pain medication to treat post operative pain, and we only give additional narcotics on a patient by patient basis.  -For most laparoscopic surgery, studies have shown that the majority of patients only need 10-15 narcotic pills, and for open surgeries most patients only need 15-20.   -Having appropriate expectations of pain and knowledge of pain management with non narcotics is important as we do not want anyone to become addicted to narcotic pain medication.  -Using ice packs in the first 48 hours and heating pads after 48 hours, wearing an abdominal binder (when recommended), and using over the counter medications are all ways to help with pain management.   -Simple acts like meditation and mindfulness practices after surgery can also help with pain control and research has proven the benefit of these practices.  Medication: Take tylenol and ibuprofen as needed for pain control, alternating every 4-6 hours.  Example:  Tylenol 1000mg  @ 6am, 12noon, 6pm, 6midnight (Do not exceed 4000mg  of tylenol a day). Ibuprofen 800mg  @ 9am, 3pm, 9pm, 3am (Do not exceed 3600mg  of ibuprofen a day).  Take Roxicodone for breakthrough pain every 4 hours.  Take  Colace for constipation related to narcotic pain medication. If you do not have a bowel movement in 2 days, take Miralax over the counter.  Drink plenty of water to also prevent constipation.   Contact Information: If you have questions or concerns, please call our office, [phone], Monday- Thursday 8AM-5PM and Friday 8AM-12Noon.  If it is after hours or on the weekend, please call [facility] Main Number, [phone], and ask to speak to the  surgeon on call for Dr. Constance Haw at [facility].    Open Hernia Repair, Adult, Care After This sheet gives you information about how to care for yourself after your procedure. Your health care provider may also give you more specific instructions. If you have problems or questions, contact your health care provider. What can I expect after the procedure? After the procedure, it is common to have:  Mild discomfort.  Slight bruising.  Minor swelling.  Pain in the abdomen. Follow these instructions at home: Incision care   Follow instructions from your health care provider about how to take care of your incision area. Make sure you: ? Wash your hands with soap and water before you change your bandage (dressing). If soap and water are not available, use hand sanitizer. ? Change your dressing as told by your health care provider. ? Leave stitches (sutures), skin glue, or adhesive strips in place. These skin closures may need to stay in place for 2 weeks or longer. If adhesive strip edges start to loosen and curl up, you may trim the loose edges. Do not remove adhesive strips completely unless your health care provider tells you to do that.  Check your incision area every day for signs of infection. Check for: ? More redness, swelling, or pain. ? More fluid or blood. ? Warmth. ? Pus or a bad smell. Activity  Do not drive or use heavy machinery while taking prescription pain medicine. Do not drive until your health care provider approves.  Until your health care provider approves: ? Do not lift anything that is heavier than 10 lb (4.5 kg). ? Do not play contact sports.  Return to your normal activities as told by your health care provider. Ask your health care provider what activities are safe. General instructions  To prevent or treat constipation while you are taking prescription pain medicine, your health care provider may recommend that you: ? Drink enough fluid to keep your urine  clear or pale yellow. ? Take over-the-counter or prescription medicines. ? Eat foods that are high in fiber, such as fresh fruits and vegetables, whole grains, and beans. ? Limit foods that are high in fat and processed sugars, such as fried and sweet foods.  Take over-the-counter and prescription medicines only as told by your health care provider.  Do not take tub baths or go swimming until your health care provider approves.  Keep all follow-up visits as told by your health care provider. This is important. Contact a health care provider if:  You develop a rash.  You have more redness, swelling, or pain around your incision.  You have more fluid or blood coming from your incision.  Your incision feels warm to the touch.  You have pus or a bad smell coming from your incision.  You have a fever or chills.  You have blood in your stool (feces).  You have not had a bowel movement in 2-3 days.  Your pain is not controlled with medicine. Get help right away if:  You have chest  pain or shortness of breath.  You feel light-headed or feel faint.  You have severe pain.  You vomit and your pain is worse. This information is not intended to replace advice given to you by your health care provider. Make sure you discuss any questions you have with your health care provider. Document Revised: 06/12/2017 Document Reviewed: 12/12/2015 Elsevier Patient Education  2020 [place] Anesthesia, Adult, Care After This sheet gives you information about how to care for yourself after your procedure. Your health care provider may also give you more specific instructions. If you have problems or questions, contact your health care provider. What can I expect after the procedure? After the procedure, the following side effects are common:  Pain or discomfort at the IV site.  Nausea.  Vomiting.  Sore throat.  Trouble concentrating.  Feeling cold or chills.  Weak  or tired.  Sleepiness and fatigue.  Soreness and body aches. These side effects can affect parts of the body that were not involved in surgery. Follow these instructions at home:  For at least 24 hours after the procedure:  Have a responsible adult stay with you. It is important to have someone help care for you until you are awake and alert.  Rest as needed.  Do not: ? Participate in activities in which you could fall or become injured. ? Drive. ? Use heavy machinery. ? Drink alcohol. ? Take sleeping pills or medicines that cause drowsiness. ? Make important decisions or sign legal documents. ? Take care of children on your own. Eating and drinking  Follow any instructions from your health care provider about eating or drinking restrictions.  When you feel hungry, start by eating small amounts of foods that are soft and easy to digest (bland), such as toast. Gradually return to your regular diet.  Drink enough fluid to keep your urine pale yellow.  If you vomit, rehydrate by drinking water, juice, or clear broth. General instructions  If you have sleep apnea, surgery and certain medicines can increase your risk for breathing problems. Follow instructions from your health care provider about wearing your sleep device: ? Anytime you are sleeping, including during daytime naps. ? While taking prescription pain medicines, sleeping medicines, or medicines that make you drowsy.  Return to your normal activities as told by your health care provider. Ask your health care provider what activities are safe for you.  Take over-the-counter and prescription medicines only as told by your health care provider.  If you smoke, do not smoke without supervision.  Keep all follow-up visits as told by your health care provider. This is important. Contact a health care provider if:  You have nausea or vomiting that does not get better with medicine.  You cannot eat or drink without  vomiting.  You have pain that does not get better with medicine.  You are unable to pass urine.  You develop a skin rash.  You have a fever.  You have redness around your IV site that gets worse. Get help right away if:  You have difficulty breathing.  You have chest pain.  You have blood in your urine or stool, or you vomit blood. Summary  After the procedure, it is common to have a sore throat or nausea. It is also common to feel tired.  Have a responsible adult stay with you for the first 24 hours after general anesthesia. It is important to have someone help care for you  until you are awake and alert.  When you feel hungry, start by eating small amounts of foods that are soft and easy to digest (bland), such as toast. Gradually return to your regular diet.  Drink enough fluid to keep your urine pale yellow.  Return to your normal activities as told by your health care provider. Ask your health care provider what activities are safe for you. This information is not intended to replace advice given to you by your health care provider. Make sure you discuss any questions you have with your health care provider. Document Revised: 07/03/2017 Document Reviewed: 02/13/2017 Elsevier Patient Education  2020 [place] UNTIL Sunday July 01, 2020. DO NOT USE ANY ADDITIONAL NUMBING MEDICATIONS WITHOUT CONSULTING A PHYSICIAN   Bupivacaine Liposomal Suspension for Injection What is this medicine? BUPIVACAINE LIPOSOMAL (bue PIV a kane LIP oh som al) is an anesthetic. It causes loss of feeling in the skin or other tissues. It is used to prevent and to treat pain from some procedures. This medicine may be used for other purposes; ask your health care provider or pharmacist if you have questions. COMMON BRAND NAME(S): EXPAREL What should I tell my health care provider before I take this medicine? They need to know if  you have any of these conditions:  G6PD deficiency  heart disease  kidney disease  liver disease  low blood pressure  lung or breathing disease, like asthma  an unusual or allergic reaction to bupivacaine, other medicines, foods, dyes, or preservatives  pregnant or trying to get pregnant  breast-feeding How should I use this medicine? This medicine is for injection into the affected area. It is given by a health care professional in a hospital or clinic setting. Talk to your pediatrician regarding the use of this medicine in children. Special care may be needed. Overdosage: If you think you have taken too much of this medicine contact a poison control center or emergency room at once. NOTE: This medicine is only for you. Do not share this medicine with others. What if I miss a dose? This does not apply. What may interact with this medicine? This medicine may interact with the following medications:  acetaminophen  certain antibiotics like dapsone, nitrofurantoin, aminosalicylic acid, sulfonamides  certain medicines for seizures like phenobarbital, phenytoin, valproic acid  chloroquine  cyclophosphamide  flutamide  hydroxyurea  ifosfamide  metoclopramide  nitric oxide  nitroglycerin  nitroprusside  nitrous oxide  other local anesthetics like lidocaine, pramoxine, tetracaine  primaquine  quinine  rasburicase  sulfasalazine This list may not describe all possible interactions. Give your health care provider a list of all the medicines, herbs, non-prescription drugs, or dietary supplements you use. Also tell them if you smoke, drink alcohol, or use illegal drugs. Some items may interact with your medicine. What should I watch for while using this medicine? Your condition will be monitored carefully while you are receiving this medicine. Be careful to avoid injury while the area is numb, and you are not aware of pain. What side effects may I notice from  receiving this medicine? Side effects that you should report to your doctor or health care professional as soon as possible:  allergic reactions like skin rash, itching or hives, swelling of the face, lips, or tongue  seizures  signs and symptoms of a dangerous change in heartbeat or heart rhythm like chest pain; dizziness; fast, irregular heartbeat; palpitations; feeling faint or lightheaded; falls;  breathing problems  signs and symptoms of methemoglobinemia such as pale, gray, or blue colored skin; headache; fast heartbeat; shortness of breath; feeling faint or lightheaded, falls; tiredness Side effects that usually do not require medical attention (report to your doctor or health care professional if they continue or are bothersome):  anxious  back pain  changes in taste  changes in vision  constipation  dizziness  fever  nausea, vomiting This list may not describe all possible side effects. Call your doctor for medical advice about side effects. You may report side effects to FDA at 1-800-FDA-1088. Where should I keep my medicine? This drug is given in a hospital or clinic and will not be stored at home. NOTE: This sheet is a summary. It may not cover all possible information. If you have questions about this medicine, talk to your doctor, pharmacist, or health care provider.  2020 Elsevier/Gold Standard (2019-04-12 10:48:23)       Oxycodone tablets or capsules What is this medicine? OXYCODONE (ox i KOE done) is a pain reliever. It is used to treat moderate to severe pain. This medicine may be used for other purposes; ask your health care provider or pharmacist if you have questions. COMMON BRAND NAME(S): Dazidox, Endocodone, Oxaydo, OXECTA, OxyIR, Percolone, Roxicodone, Roxybond What should I tell my health care provider before I take this medicine? They need to know if you have any of these conditions:  Addison's disease  brain tumor  head injury  heart  disease  history of drug or alcohol abuse problem  if you often drink alcohol  kidney disease  liver disease  lung or breathing disease, like asthma  mental illness  pancreatic disease  seizures  thyroid disease  an unusual or allergic reaction to oxycodone, codeine, hydrocodone, morphine, other medicines, foods, dyes, or preservatives  pregnant or trying to get pregnant  breast-feeding How should I use this medicine? Take this medicine by mouth with a glass of water. Follow the directions on the prescription label. You can take it with or without food. If it upsets your stomach, take it with food. Take your medicine at regular intervals. Do not take it more often than directed. Do not stop taking except on your doctor's advice. Some brands of this medicine, like Oxecta, have special instructions. Ask your doctor or pharmacist if these directions are for you: Do not cut, crush or chew this medicine. Swallow only one tablet at a time. Do not wet, soak, or lick the tablet before you take it. A special MedGuide will be given to you by the pharmacist with each prescription and refill. Be sure to read this information carefully each time. Talk to your pediatrician regarding the use of this medicine in children. Special care may be needed. Overdosage: If you think you have taken too much of this medicine contact a poison control center or emergency room at once. NOTE: This medicine is only for you. Do not share this medicine with others. What if I miss a dose? If you miss a dose, take it as soon as you can. If it is almost time for your next dose, take only that dose. Do not take double or extra doses. What may interact with this medicine? This medicine may interact with the following medications:  alcohol  antihistamines for allergy, cough and cold  antiviral medicines for HIV or AIDS  atropine  certain antibiotics like clarithromycin, erythromycin, linezolid,  rifampin  certain medicines for anxiety or sleep  certain medicines for bladder  problems like oxybutynin, tolterodine  certain medicines for depression like amitriptyline, fluoxetine, sertraline  certain medicines for fungal infections like ketoconazole, itraconazole, voriconazole  certain medicines for migraine headache like almotriptan, eletriptan, frovatriptan, naratriptan, rizatriptan, sumatriptan, zolmitriptan  certain medicines for nausea or vomiting like dolasetron, ondansetron, palonosetron  certain medicines for Parkinson's disease like benztropine, trihexyphenidyl  certain medicines for seizures like phenobarbital, phenytoin, primidone  certain medicines for stomach problems like dicyclomine, hyoscyamine  certain medicines for travel sickness like scopolamine  diuretics  general anesthetics like halothane, isoflurane, methoxyflurane, propofol  ipratropium  local anesthetics like lidocaine, pramoxine, tetracaine  MAOIs like Carbex, Eldepryl, Marplan, Nardil, and Parnate  medicines that relax muscles for surgery  methylene blue  nilotinib  other narcotic medicines for pain or cough  phenothiazines like chlorpromazine, mesoridazine, prochlorperazine, thioridazine This list may not describe all possible interactions. Give your health care provider a list of all the medicines, herbs, non-prescription drugs, or dietary supplements you use. Also tell them if you smoke, drink alcohol, or use illegal drugs. Some items may interact with your medicine. What should I watch for while using this medicine? Tell your health care provider if your pain does not go away, if it gets worse, or if you have new or a different type of pain. You may develop tolerance to this drug. Tolerance means that you will need a higher dose of the drug for pain relief. Tolerance is normal and is expected if you take this drug for a long time. There are different types of narcotic drugs (opioids) for  pain. If you take more than one type at the same time, you may have more side effects. Give your health care provider a list of all drugs you use. He or she will tell you how much drug to take. Do not take more drug than directed. Get emergency help right away if you have problems breathing. Do not suddenly stop taking your drug because you may develop a severe reaction. Your body becomes used to the drug. This does NOT mean you are addicted. Addiction is a behavior related to getting and using a drug for a nonmedical reason. If you have pain, you have a medical reason to take pain drug. Your health care provider will tell you how much drug to take. If your health care provider wants you to stop the drug, the dose will be slowly lowered over time to avoid any side effects. Talk to your health care provider about naloxone and how to get it. Naloxone is an emergency drug used for an opioid overdose. An overdose can happen if you take too much opioid. It can also happen if an opioid is taken with some other drugs or substances, like alcohol. Know the symptoms of an overdose, like trouble breathing, unusually tired or sleepy, or not being able to respond or wake up. Make sure to tell caregivers and close contacts where it is stored. Make sure they know how to use it. After naloxone is given, you must get emergency help right away. Naloxone is a temporary treatment. Repeat doses may be needed. You may get drowsy or dizzy. Do not drive, use machinery, or do anything that needs mental alertness until you know how this drug affects you. Do not stand up or sit up quickly, especially if you are an older patient. This reduces the risk of dizzy or fainting spells. Alcohol may interfere with the effect of this drug. Avoid alcoholic drinks. This drug will cause constipation. If you do  not have a bowel movement for 3 days, call your health care provider. Your mouth may get dry. Chewing sugarless gum or sucking hard candy and  drinking plenty of water may help. Contact your health care provider if the problem does not go away or is severe. The tablet shell for some brands of this drug does not dissolve. This is normal. The tablet shell may appear whole in the stool. This is not a cause for concern. What side effects may I notice from receiving this medicine? Side effects that you should report to your doctor or health care professional as soon as possible:  allergic reactions like skin rash, itching or hives, swelling of the face, lips, or tongue  breathing problems  confusion  signs and symptoms of low blood pressure like dizziness; feeling faint or lightheaded, falls; unusually weak or tired  trouble passing urine or change in the amount of urine  trouble swallowing Side effects that usually do not require medical attention (report to your doctor or health care professional if they continue or are bothersome):  constipation  dry mouth  nausea, vomiting  tiredness This list may not describe all possible side effects. Call your doctor for medical advice about side effects. You may report side effects to FDA at 1-800-FDA-1088. Where should I keep my medicine? Keep out of the reach of children. This medicine can be abused. Keep your medicine in a safe place to protect it from theft. Do not share this medicine with anyone. Selling or giving away this medicine is dangerous and against the law. Store at room temperature between 15 and 30 degrees C (59 and 86 degrees F). Protect from light. Keep container tightly closed. This medicine may cause harm and death if it is taken by other adults, children, or pets. Return medicine that has not been used to an official disposal site. Contact the DEA at (252)295-5760 or your city/county government to find a site. If you cannot return the medicine, flush it down the toilet. Do not use the medicine after the expiration date. NOTE: This sheet is a summary. It may not cover  all possible information. If you have questions about this medicine, talk to your doctor, pharmacist, or health care provider.  2020 Elsevier/Gold Standard (2019-02-08 12:47:59)   Ondansetron oral dissolving tablet What is this medicine? ONDANSETRON (on DAN se tron) is used to treat nausea and vomiting caused by chemotherapy. It is also used to prevent or treat nausea and vomiting after surgery. This medicine may be used for other purposes; ask your health care provider or pharmacist if you have questions. COMMON BRAND NAME(S): Zofran ODT What should I tell my health care provider before I take this medicine? They need to know if you have any of these conditions:  heart disease  history of irregular heartbeat  liver disease  low levels of magnesium or potassium in the blood  an unusual or allergic reaction to ondansetron, granisetron, other medicines, foods, dyes, or preservatives  pregnant or trying to get pregnant  breast-feeding How should I use this medicine? These tablets are made to dissolve in the mouth. Do not try to push the tablet through the foil backing. With dry hands, peel away the foil backing and gently remove the tablet. Place the tablet in the mouth and allow it to dissolve, then swallow. While you may take these tablets with water, it is not necessary to do so. Talk to your pediatrician regarding the use of this medicine in children. Special  care may be needed. Overdosage: If you think you have taken too much of this medicine contact a poison control center or emergency room at once. NOTE: This medicine is only for you. Do not share this medicine with others. What if I miss a dose? If you miss a dose, take it as soon as you can. If it is almost time for your next dose, take only that dose. Do not take double or extra doses. What may interact with this medicine? Do not take this medicine with any of the following medications:  apomorphine  certain medicines for  fungal infections like fluconazole, itraconazole, ketoconazole, posaconazole, voriconazole  cisapride  dronedarone  pimozide  thioridazine This medicine may also interact with the following medications:  carbamazepine  certain medicines for depression, anxiety, or psychotic disturbances  fentanyl  linezolid  MAOIs like Carbex, Eldepryl, Marplan, Nardil, and Parnate  methylene blue (injected into a vein)  other medicines that prolong the QT interval (cause an abnormal heart rhythm) like dofetilide, ziprasidone  phenytoin  rifampicin  tramadol This list may not describe all possible interactions. Give your health care provider a list of all the medicines, herbs, non-prescription drugs, or dietary supplements you use. Also tell them if you smoke, drink alcohol, or use illegal drugs. Some items may interact with your medicine. What should I watch for while using this medicine? Check with your doctor or health care professional as soon as you can if you have any sign of an allergic reaction. What side effects may I notice from receiving this medicine? Side effects that you should report to your doctor or health care professional as soon as possible:  allergic reactions like skin rash, itching or hives, swelling of the face, lips, or tongue  breathing problems  confusion  dizziness  fast or irregular heartbeat  feeling faint or lightheaded, falls  fever and chills  loss of balance or coordination  seizures  sweating  swelling of the hands and feet  tightness in the chest  tremors  unusually weak or tired Side effects that usually do not require medical attention (report to your doctor or health care professional if they continue or are bothersome):  constipation or diarrhea  headache This list may not describe all possible side effects. Call your doctor for medical advice about side effects. You may report side effects to FDA at 1-800-FDA-1088. Where  should I keep my medicine? Keep out of the reach of children. Store between 2 and 30 degrees C (36 and 86 degrees F). Throw away any unused medicine after the expiration date. NOTE: This sheet is a summary. It may not cover all possible information. If you have questions about this medicine, talk to your doctor, pharmacist, or health care provider.  2020 Elsevier/Gold Standard (2018-06-22 07:14:10)   Docusate capsules What is this medicine? DOCUSATE (doc CUE sayt) is stool softener. It helps prevent constipation and straining or discomfort associated with hard or dry stools. This medicine may be used for other purposes; ask your health care provider or pharmacist if you have questions. COMMON BRAND NAME(S): [employer] Stool Softner, Colace, Colace Clear, Correctol, D.O.S., DC, Doc-Q-Lace, DocuLace, Docusoft S, DOK, DOK Extra Strength, Dulcolax, Genasoft, Kao-Tin, Kaopectate Liqui-Gels, Phillips Stool Softener, Stool Softener, Stool Softner DC, Sulfolax, Sur-Q-Lax, Surfak, Uni-Ease What should I tell my health care provider before I take this medicine? They need to know if you have any of these conditions:  nausea or vomiting  severe constipation  stomach pain  sudden change in  bowel habit lasting more than 2 weeks  an unusual or allergic reaction to docusate, other medicines, foods, dyes, or preservatives  pregnant or trying to get pregnant  breast-feeding How should I use this medicine? Take this medicine by mouth with a glass of water. Follow the directions on the label. Take your doses at regular intervals. Do not take your medicine more often than directed. Talk to your pediatrician regarding the use of this medicine in children. While this medicine may be prescribed for children as young as 2 years for selected conditions, precautions do apply. Overdosage: If you think you have taken too much of this medicine contact a poison control center or emergency room at once. NOTE: This  medicine is only for you. Do not share this medicine with others. What if I miss a dose? If you miss a dose, take it as soon as you can. If it is almost time for your next dose, take only that dose. Do not take double or extra doses. What may interact with this medicine?  mineral oil This list may not describe all possible interactions. Give your health care provider a list of all the medicines, herbs, non-prescription drugs, or dietary supplements you use. Also tell them if you smoke, drink alcohol, or use illegal drugs. Some items may interact with your medicine. What should I watch for while using this medicine? Do not use for more than one week without advice from your doctor or health care professional. If your constipation returns, check with your doctor or health care professional. Drink plenty of water while taking this medicine. Drinking water helps decrease constipation. Stop using this medicine and contact your doctor or health care professional if you experience any rectal bleeding or do not have a bowel movement after use. These could be signs of a more serious condition. What side effects may I notice from receiving this medicine? Side effects that you should report to your doctor or health care professional as soon as possible:  allergic reactions like skin rash, itching or hives, swelling of the face, lips, or tongue Side effects that usually do not require medical attention (report to your doctor or health care professional if they continue or are bothersome):  diarrhea  stomach cramps  throat irritation This list may not describe all possible side effects. Call your doctor for medical advice about side effects. You may report side effects to FDA at 1-800-FDA-1088. Where should I keep my medicine? Keep out of the reach of children. Store at room temperature between 15 and 30 degrees C (59 and 86 degrees F). Throw away any unused medicine after the expiration date. NOTE:  This sheet is a summary. It may not cover all possible information. If you have questions about this medicine, talk to your doctor, pharmacist, or health care provider.  2020 Elsevier/Gold Standard (2007-10-21 15:56:49)

## 2020-06-27 NOTE — Transfer of Care (Signed)
Immediate Anesthesia Transfer of Care Note  Patient: Alicia Ross  Procedure(s) Performed: HERNIA REPAIR INCISIONAL;OPEN WITH MESH (N/A Abdomen)  Patient Location: PACU  Anesthesia Type:General  Level of Consciousness: awake and patient cooperative  Airway & Oxygen Therapy: Patient Spontanous Breathing and Patient connected to nasal cannula oxygen  Post-op Assessment: Report given to RN and Post -op Vital signs reviewed and stable  Post vital signs: Reviewed and stable  Last Vitals:  Vitals Value Taken Time  BP 126/85   Temp 97.9   Pulse 71 06/27/20 0824  Resp 14 06/27/20 0824  SpO2 97 % 06/27/20 0824  Vitals shown include unvalidated device data.  Last Pain:  Vitals:   06/27/20 0638  TempSrc: Oral  PainSc: 0-No pain      Patients Stated Pain Goal: 8 (46/50/35 4656)  Complications: No complications documented.

## 2020-06-27 NOTE — Anesthesia Preprocedure Evaluation (Addendum)
Anesthesia Evaluation  Patient identified by MRN, date of birth, ID band Patient awake    Reviewed: Allergy & Precautions, H&P , NPO status , Patient's Chart, lab work & pertinent test results, reviewed documented beta blocker date and time   Airway Mallampati: II  TM Distance: >3 FB Neck ROM: full    Dental no notable dental hx.    Pulmonary neg pulmonary ROS,    Pulmonary exam normal breath sounds clear to auscultation       Cardiovascular Exercise Tolerance: Good negative cardio ROS   Rhythm:regular Rate:Normal     Neuro/Psych  Headaches, negative psych ROS   GI/Hepatic Neg liver ROS, GERD  Medicated,  Endo/Other  negative endocrine ROS  Renal/GU negative Renal ROS  negative genitourinary   Musculoskeletal   Abdominal   Peds  Hematology negative hematology ROS (+)   Anesthesia Other Findings   Reproductive/Obstetrics negative OB ROS                             Anesthesia Physical Anesthesia Plan  ASA: II  Anesthesia Plan: General   Post-op Pain Management:    Induction:   PONV Risk Score and Plan: Ondansetron and Dexamethasone  Airway Management Planned:   Additional Equipment:   Intra-op Plan:   Post-operative Plan:   Informed Consent: I have reviewed the patients History and Physical, chart, labs and discussed the procedure including the risks, benefits and alternatives for the proposed anesthesia with the patient or authorized representative who has indicated his/her understanding and acceptance.     Dental Advisory Given  Plan Discussed with: CRNA  Anesthesia Plan Comments:        Anesthesia Quick Evaluation

## 2020-06-27 NOTE — Interval H&P Note (Signed)
History and Physical Interval Note:  06/27/2020 7:21 AM  Alicia Ross  has presented today for surgery, with the diagnosis of Incisional hernia.  The various methods of treatment have been discussed with the patient and family. After consideration of risks, benefits and other options for treatment, the patient has consented to  Procedure(s): HERNIA REPAIR INCISIONAL;OPEN; W/MESH (N/A) as a surgical intervention.  The patient's history has been reviewed, patient examined, no change in status, stable for surgery.  I have reviewed the patient's chart and labs.  Questions were answered to the patient's satisfaction.    No changes.  [name]

## 2020-06-27 NOTE — Anesthesia Postprocedure Evaluation (Signed)
Anesthesia Post Note  Patient: Deshay Kirstein  Procedure(s) Performed: HERNIA REPAIR INCISIONAL;OPEN WITH MESH (N/A Abdomen)  Patient location during evaluation: PACU Anesthesia Type: General Level of consciousness: awake and alert and patient cooperative Pain management: pain level controlled Vital Signs Assessment: post-procedure vital signs reviewed and stable Respiratory status: spontaneous breathing Cardiovascular status: stable Postop Assessment: no apparent nausea or vomiting Anesthetic complications: no   No complications documented.   Last Vitals:  Vitals:   06/27/20 0900 06/27/20 0934  BP: 127/84 116/86  Pulse: 69 74  Resp: 13 16  Temp:  36.9 C  SpO2: 96% 97%    Last Pain:  Vitals:   06/27/20 0934  TempSrc: Oral  PainSc: 6                  Kynzee Devinney

## 2020-06-27 NOTE — Progress Notes (Signed)
[facility]  Updated friend Threasa Beards that surgery was completed. Abdominal Binder. Does have allergy to codeine that is GI upset, unsure when/ what medication this was from but did prescribe roxicodone and zofran as people get nausea with roxi at times.  Curlene Labrum, MD [facility] [address] [place], [place] [address] [phone] (office)

## 2020-06-28 ENCOUNTER — Encounter (HOSPITAL_COMMUNITY): Payer: Self-pay | Admitting: General Surgery

## 2020-07-04 ENCOUNTER — Ambulatory Visit
Admission: EM | Admit: 2020-07-04 | Discharge: 2020-07-04 | Disposition: A | Discharge status: Home / Self Care | Payer: 59 | Attending: Emergency Medicine | Admitting: Emergency Medicine

## 2020-07-04 ENCOUNTER — Other Ambulatory Visit: Payer: Self-pay

## 2020-07-04 DIAGNOSIS — L2389 Allergic contact dermatitis due to other agents: Secondary | ICD-10-CM

## 2020-07-04 MED ORDER — DEXAMETHASONE SODIUM PHOSPHATE 10 MG/ML IJ SOLN
10.0000 mg | Freq: Once | INTRAMUSCULAR | Status: AC
  Administered 2020-07-04: 10 mg via INTRAMUSCULAR

## 2020-07-04 MED ORDER — CETIRIZINE HCL 10 MG PO TABS: 10.0000 mg | ORAL_TABLET | Freq: Every day | ORAL | 0 refills | Status: DC

## 2020-07-04 MED ORDER — FAMOTIDINE 20 MG PO TABS
20.0000 mg | ORAL_TABLET | Freq: Once | ORAL | Status: AC
  Administered 2020-07-04: 20 mg via ORAL

## 2020-07-04 MED ORDER — PREDNISONE 10 MG (21) PO TBPK: ORAL_TABLET | Freq: Every day | ORAL | 0 refills | Status: DC

## 2020-07-04 NOTE — ED Provider Notes (Signed)
[facility]   [phone] 07/04/20 Arrival Time: 0800  Cc: Allergic reaction  SUBJECTIVE:  Alicia Ross is a 44 y.o. female who presented to the urgent care with a complaint of rash for the past 2 to 3 days.  States she is 1 week postop from hernia repair and was prescribed pain medication and Colace as a stool softener.  Has not use the pain medication but has been using Colace and developed a rash thereafter.   Has tried OTC Benadryl and Claritin without relief.  Denies aggravating factor.  Denies previous symptoms in the past.   Denies fever, chills, nausea, vomiting, erythema, redness, swollen glands, oral manifestations such as throat swelling/ tingling, mouth swelling/ tingling, tongue swelling/tingling, dyspnea, SOB, chest pain, abdominal pain, changes in bowel or bladder function.     ROS: As per HPI.  All other pertinent ROS negative.      Past Medical History:  Diagnosis Date  . GERD (gastroesophageal reflux disease)   . Headache    Past Surgical History:  Procedure Laterality Date  . CESAREAN SECTION  01/ 2008  . INCISIONAL HERNIA REPAIR N/A 06/27/2020   Procedure: HERNIA REPAIR INCISIONAL;OPEN WITH MESH;  Surgeon: Lucretia Roers, MD;  Location: [facility];  Service: General;  Laterality: N/A;  . KNEE ARTHROSCOPY Left 2006  . LAPAROSCOPIC TUBAL LIGATION Bilateral 08/25/2014   Procedure: LAPAROSCOPIC TUBAL LIGATION with Filshie Clips;  Surgeon: Zelphia Cairo, MD;  Location: [facility];  Service: Gynecology;  Laterality: Bilateral;  . WISDOM TOOTH EXTRACTION  1995  . WRIST GANGLION EXCISION Left 1994  & 2004   in 2004  also removed spurs   Allergies  Allergen Reactions  . Codeine Other (See Comments)    GI UPSET   No current facility-administered medications on file prior to encounter.   Current Outpatient Medications on File Prior to Encounter  Medication Sig Dispense Refill  . cholecalciferol (VITAMIN D3) 25 MCG (1000 UNIT) tablet Take  1,000 Units by mouth daily.    Marland Kitchen docusate sodium (COLACE) 100 MG capsule Take 1 capsule (100 mg total) by mouth 2 (two) times daily as needed for mild constipation (while taking narcotic). 60 capsule 2  . loratadine (CLARITIN) 10 MG tablet Take 10 mg by mouth daily.    . ondansetron (ZOFRAN) 4 MG tablet Take 1 tablet (4 mg total) by mouth every 8 (eight) hours as needed. 30 tablet 1  . oxyCODONE (ROXICODONE) 5 MG immediate release tablet Take 1 tablet (5 mg total) by mouth every 4 (four) hours as needed for severe pain or breakthrough pain. 10 tablet 0  . triamcinolone (NASACORT) 55 MCG/ACT AERO nasal inhaler Place 2 sprays into the nose daily.       Social History   Socioeconomic History  . Marital status: Married    Spouse name: Not on file  . Number of children: Not on file  . Years of education: Not on file  . Highest education level: Not on file  Occupational History  . Not on file  Tobacco Use  . Smoking status: Never Smoker  . Smokeless tobacco: Never Used  Vaping Use  . Vaping Use: Never used  Substance and Sexual Activity  . Alcohol use: No  . Drug use: No  . Sexual activity: Yes  Other Topics Concern  . Not on file  Social History Narrative  . Not on file   Social Determinants of Health   Financial Resource Strain: Not on file  Food Insecurity: Not  on file  Transportation Needs: Not on file  Physical Activity: Not on file  Stress: Not on file  Social Connections: Not on file  Intimate Partner Violence: Not on file   Family History  Problem Relation Age of Onset  . Cancer Other        breast  . Hyperlipidemia Other   . Breast cancer Paternal Aunt 9  . Breast cancer Paternal Grandmother 43     OBJECTIVE:  Vitals:   07/04/20 0819  BP: 121/87  Pulse: 98  Resp: 18  Temp: 98.6 F (37 C)  SpO2: 94%     Physical Exam Vitals and nursing note reviewed.  Constitutional:      General: She is not in acute distress.    Appearance: Normal appearance. She  is normal weight. She is not ill-appearing, toxic-appearing or diaphoretic.  HENT:     Head: Normocephalic.  Cardiovascular:     Rate and Rhythm: Normal rate and regular rhythm.     Pulses: Normal pulses.     Heart sounds: Normal heart sounds. No murmur heard. No friction rub. No gallop.   Pulmonary:     Effort: Pulmonary effort is normal. No respiratory distress.     Breath sounds: Normal breath sounds. No stridor. No wheezing, rhonchi or rales.  Chest:     Chest wall: No tenderness.  Skin:    General: Skin is warm.     Findings: Rash present. Rash is macular.  Neurological:     Mental Status: She is alert and oriented to person, place, and time.      ASSESSMENT & PLAN:  1. Allergic contact dermatitis due to other agents     Meds ordered this encounter  Medications  . dexamethasone (DECADRON) injection 10 mg  . famotidine (PEPCID) tablet 20 mg  . predniSONE (STERAPRED UNI-PAK 21 TAB) 10 MG (21) TBPK tablet    Sig: Take by mouth daily. Take 6 tabs by mouth daily  for 1 days, then 5 tabs for 1 days, then 4 tabs for 1 days, then 3 tabs for 1 days, 2 tabs for 1 days, then 1 tab by mouth daily for 1 days    Dispense:  21 tablet    Refill:  0  . cetirizine (ZYRTEC ALLERGY) 10 MG tablet    Sig: Take 1 tablet (10 mg total) by mouth daily.    Dispense:  30 tablet    Refill:  0    No orders of the defined types were placed in this encounter.   Discharge instructions  Decadron 10 mg shot given in office Rest push fluids Stop Claritin and Benadryl Prescribed prednisone and Zyrtec Take medication as prescribed and as directed Return sooner or go to the ED if you have any new or worsening symptoms such as difficulty breathing, shortness of breath, chest pain, nausea, vomiting, throat tightness or swelling, tongue swelling or tingling, worsening lip or facial swelling, abdominal pain, changes in bowel or bladder habits, no improvement despite medications, etc...   Reviewed  expectations re: course of current medical issues. Questions answered. Outlined signs and symptoms indicating need for more acute intervention. Patient verbalized understanding. After Visit Summary given.          Emerson Monte, [place] 07/04/20 [phone]

## 2020-07-04 NOTE — ED Triage Notes (Signed)
Pt presents with rash for past few days, pt is 1 week post op from hernia repair, pt has 2 different types of rashes , pt also has swelling to hands

## 2020-07-04 NOTE — Discharge Instructions (Addendum)
Decadron 10 mg IM  given in office Pepcid 20 mg was given in office Rest push fluids Stop Claritin and Benadryl Prescribed prednisone and Zyrtec Take medication as prescribed and as directed Return sooner or go to the ED if you have any new or worsening symptoms such as difficulty breathing, shortness of breath, chest pain, nausea, vomiting, throat tightness or swelling, tongue swelling or tingling, worsening lip or facial swelling, abdominal pain, changes in bowel or bladder habits, no improvement despite medications, etc..Marland Kitchen

## 2020-07-19 ENCOUNTER — Other Ambulatory Visit: Payer: Self-pay

## 2020-07-19 ENCOUNTER — Ambulatory Visit (INDEPENDENT_AMBULATORY_CARE_PROVIDER_SITE_OTHER): Payer: 59 | Admitting: General Surgery

## 2020-07-19 ENCOUNTER — Encounter: Payer: Self-pay | Admitting: General Surgery

## 2020-07-19 VITALS — BP 122/81 | HR 77 | Temp 98.5°F | Resp 14 | Ht 65.0 in | Wt 215.0 lb

## 2020-07-19 DIAGNOSIS — K432 Incisional hernia without obstruction or gangrene: Secondary | ICD-10-CM

## 2020-07-19 NOTE — Progress Notes (Signed)
[facility] Note   HPI:  45 y.o. Female presents to clinic for post-op follow-up evaluation of her incisional hernia repair. She says she is doing well and having some soreness and tightness at times. She wants to go back to work but does have to lift some.   She had a dermatitis from possibly colace and get a prednisone pack from urgent care that has resolved her rash.   Review of Systems:  No drainage No fever or chills All other review of systems: otherwise negative   Vital Signs:  BP 122/81   Pulse 77   Temp 98.5 F (36.9 C) (Oral)   Resp 14   Ht 5\' 5"  (1.651 m)   Wt 215 lb (97.5 kg)   SpO2 98%   BMI 35.78 kg/m    Physical Exam:  Physical Exam Vitals reviewed.  Cardiovascular:     Rate and Rhythm: Normal rate.  Pulmonary:     Effort: Pulmonary effort is normal.  Abdominal:     General: There is no distension.     Palpations: Abdomen is soft.     Hernia: No hernia is present.     Comments: Supraumbilical induration, incision healed, no erythema or drainage  Neurological:     Mental Status: She is alert.    Assessment:  45 y.o. yo Female with healing incisional hernia. She is doing well and can go back to work but with restrictions.  Plan:  Ok to return to work on Monday 07/23/2020. You should not lift over 10 lbs, or push or pull anything until after 08/25/2020.   Call with questions or concerns.   10/23/2020, MD [facility] [address] [place], [place] [phone] (office)

## 2020-07-19 NOTE — Patient Instructions (Signed)
Ok to return to work on Monday 07/23/2020. You should not lift over 10 lbs, or push or pull anything until after 08/25/2020.   Call with questions or concerns.

## 2020-08-09 ENCOUNTER — Ambulatory Visit (INDEPENDENT_AMBULATORY_CARE_PROVIDER_SITE_OTHER): Payer: 59 | Admitting: Family Medicine

## 2020-08-09 ENCOUNTER — Other Ambulatory Visit: Payer: Self-pay

## 2020-08-09 ENCOUNTER — Encounter: Payer: Self-pay | Admitting: Family Medicine

## 2020-08-09 VITALS — HR 74 | Temp 98.4°F | Resp 18

## 2020-08-09 DIAGNOSIS — J0110 Acute frontal sinusitis, unspecified: Secondary | ICD-10-CM

## 2020-08-09 DIAGNOSIS — R059 Cough, unspecified: Secondary | ICD-10-CM

## 2020-08-09 MED ORDER — AMOXICILLIN 500 MG PO CAPS: 500.0000 mg | ORAL_CAPSULE | Freq: Three times a day (TID) | ORAL | 0 refills | Status: DC
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
## 2020-08-12 LAB — NOVEL CORONAVIRUS, NAA: SARS-CoV-2, NAA: DETECTED — AB

## 2020-08-12 LAB — SPECIMEN STATUS REPORT

## 2020-08-12 LAB — SARS-COV-2, NAA 2 DAY TAT

## 2020-08-13 ENCOUNTER — Other Ambulatory Visit: Payer: Self-pay

## 2020-08-13 ENCOUNTER — Encounter: Payer: Self-pay | Admitting: Family Medicine

## 2020-08-13 ENCOUNTER — Telehealth: Payer: Self-pay

## 2020-08-13 ENCOUNTER — Telehealth (INDEPENDENT_AMBULATORY_CARE_PROVIDER_SITE_OTHER): Payer: 59 | Admitting: Family Medicine

## 2020-08-13 VITALS — Ht 65.0 in | Wt 210.0 lb

## 2020-08-13 DIAGNOSIS — U071 COVID-19: Secondary | ICD-10-CM | POA: Diagnosis not present

## 2020-08-13 NOTE — Progress Notes (Signed)
Patient ID: Alicia Ross, female    DOB: 1975-09-27, 45 y.o.   MRN: [id number]   No chief complaint on file.  Subjective:  CC: covid positive   This is a new problem.  Presents today via telephone visit with a complaint of testing positive on January 27.  Symptom onset was January 24.  Reports that on January 17, having issues with bronchitis and sinus infection.  Saw Dr. Lovena Le on January 27 for cough, headache, congestion and sore throat and sinus issues given amoxicillin at that time.  She continues taking amoxicillin.  Negative for fever, positive for chills, congestion, ear pain, postnasal drip, sinus pain and pressure.  Reports some chest tightness and shortness of breath with movement, recovers with resting.  Has had some muscle pain, lightheadedness and dizziness occasionally.  Has tried the amoxicillin, resting, vitamin D, and zinc, Tylenol and ibuprofen.   pt tested positive for covid on 1/27. Pt states she is feeling worse than she was when seen last week. Today cough is back, sinus drainage, not able to hear well out of ears. Headache, sinus pressure. Taking amoxil, tylenol ES, motrin.   Virtual Visit via Video Note  I connected with Adelina Mings on 08/13/20 at  9:00 AM EST by a video enabled telemedicine application and verified that I am speaking with the correct person using two identifiers.  Location: Patient: home Provider: office   I discussed the limitations of evaluation and management by telemedicine and the availability of in person appointments. The patient expressed understanding and agreed to proceed.  History of Present Illness:    Observations/Objective:   Assessment and Plan:   Follow Up Instructions:    I discussed the assessment and treatment plan with the patient. The patient was provided an opportunity to ask questions and all were answered. The patient agreed with the plan and demonstrated an understanding of the instructions.   The patient was  advised to call back or seek an in-person evaluation if the symptoms worsen or if the condition fails to improve as anticipated.  I provided 25 minutes of non-face-to-face time during this encounter.      Medical History Braeley has a past medical history of GERD (gastroesophageal reflux disease) and Headache.   Outpatient Encounter Medications as of 08/13/2020  Medication Sig  . amoxicillin (AMOXIL) 500 MG capsule Take 1 capsule (500 mg total) by mouth 3 (three) times daily.  . cholecalciferol (VITAMIN D3) 25 MCG (1000 UNIT) tablet Take 1,000 Units by mouth daily.  Marland Kitchen loratadine (CLARITIN) 10 MG tablet Take 10 mg by mouth daily.  Marland Kitchen triamcinolone (NASACORT) 55 MCG/ACT AERO nasal inhaler Place 2 sprays into the nose daily.    No facility-administered encounter medications on file as of 08/13/2020.     Review of Systems  Constitutional: Positive for chills. Negative for fever.  HENT: Positive for congestion, ear pain, postnasal drip, sinus pressure and sinus pain. Negative for sore throat.        Ear fullness.  Respiratory: Positive for cough, chest tightness and shortness of breath.        Shortness of breath with movement, recovers when resting. Tightness with deep breath  Cardiovascular: Negative for chest pain.  Gastrointestinal: Positive for diarrhea. Negative for abdominal pain, nausea and vomiting.       Diarrhea with antibiotic  Genitourinary: Positive for dysuria.       Itching burning with urination.  Musculoskeletal: Positive for myalgias.       Little achy  Skin:  Negative for rash.  Neurological: Positive for light-headedness and headaches. Negative for dizziness.     Vitals Ht 5\' 5"  (1.651 m)   Wt 210 lb (95.3 kg)   BMI 34.95 kg/m   Objective:   Physical Exam  No PE done. Able to converse throughout telephone visit without obvious shortness of breath.  Assessment and Plan   1. COVID-19 virus infection - Ambulatory referral for Covid Treatment   She will  continue to take the amoxicillin prescribed on January 27 for sinusitis.  Recommend supportive therapy, adequate hydration, over-the-counter medications for symptom relief.  Will make referral to ambulatory Covid treatment.  Declines need for inhaler, reports it makes her jittery.  Agrees with plan of care discussed today. Understands warning signs to seek further care: chest pain, shortness of breath, any significant change in health.  Understands to follow-up if symptoms worsen, do not improve.  Explained that Covid treatment center will notify if she qualifies for monoclonal antibody treatment.  If symptoms persist, she will need an in person visit for physical exam.   Chalmers Guest, NP 08/13/2020

## 2020-08-13 NOTE — Telephone Encounter (Signed)
Pt had appt with Santiago Glad by phone this morning and she was wanting to see if she was able to get on list for infusion referral that she wants to get back to her check status.  Pt call back [phone]

## 2020-08-13 NOTE — Telephone Encounter (Signed)
I called pt back and told her the referral was put in and they would contact her only if she qualifies and if she does not qualify they will not call her. She understood this. I told her to follow up with [place] if any problems

## 2020-08-13 NOTE — Telephone Encounter (Signed)
Called to discuss with patient about COVID-19 symptoms and the use of one of the available treatments for those with mild to moderate Covid symptoms and at a high risk of hospitalization.  Pt appears to qualify for outpatient treatment due to co-morbid conditions and/or a member of an at-risk group in accordance with the FDA Emergency Use Authorization.    Symptom onset: 07/30/20 Vaccinated: No Booster? No Immunocompromised? No Qualifiers: Obesity  Pt. Is out of 7 day window for treatment.  Alicia Ross

## 2020-08-15 ENCOUNTER — Other Ambulatory Visit: Payer: Self-pay | Admitting: Family Medicine

## 2020-08-15 ENCOUNTER — Encounter: Payer: Self-pay | Admitting: Family Medicine

## 2020-08-15 ENCOUNTER — Telehealth: Payer: Self-pay

## 2020-08-15 DIAGNOSIS — R059 Cough, unspecified: Secondary | ICD-10-CM

## 2020-08-15 DIAGNOSIS — U071 COVID-19: Secondary | ICD-10-CM

## 2020-08-15 MED ORDER — FLOVENT HFA 44 MCG/ACT IN AERO: 2.0000 | INHALATION_SPRAY | Freq: Two times a day (BID) | RESPIRATORY_TRACT | 0 refills | Status: DC

## 2020-08-15 NOTE — Progress Notes (Signed)
Covid infection with slight shortness of breath with cough.

## 2020-08-15 NOTE — Telephone Encounter (Signed)
Wants to talk to nurse she was seen Monday virtual and said that she is getting worse.  Pt call back [phone]

## 2020-08-15 NOTE — Telephone Encounter (Signed)
Coughing more today, sinus pressure is back, nasal drainage - yellow to orange, a little ear pain and feels full. A little more tightness in chest. Slightly sob when coughing. No fever. Was prescribed amoxil and states it is not helping.  [facility] [address] ( please route to pool since i'm off this afternoon. Thanks)

## 2020-08-16 ENCOUNTER — Encounter: Payer: Self-pay | Admitting: Family Medicine

## 2020-08-16 ENCOUNTER — Ambulatory Visit (INDEPENDENT_AMBULATORY_CARE_PROVIDER_SITE_OTHER): Payer: 59 | Admitting: Family Medicine

## 2020-08-16 VITALS — HR 79 | Temp 97.7°F | Resp 16

## 2020-08-16 DIAGNOSIS — J40 Bronchitis, not specified as acute or chronic: Secondary | ICD-10-CM | POA: Insufficient documentation

## 2020-08-16 DIAGNOSIS — R059 Cough, unspecified: Secondary | ICD-10-CM | POA: Diagnosis not present

## 2020-08-16 DIAGNOSIS — U071 COVID-19: Secondary | ICD-10-CM | POA: Diagnosis not present

## 2020-08-16 MED ORDER — PREDNISONE 20 MG PO TABS: 20.0000 mg | ORAL_TABLET | Freq: Every day | ORAL | 0 refills | Status: DC

## 2020-08-16 NOTE — Patient Instructions (Signed)
Take only Tylenol while on prednisone. Stop Amoxicillin, this is likely viral in nature. Saline flushes Humi fication for sinus pain and pressure     Recommend supportive therapy while you are recovering:   1) Get lots of rest.  2) Take over the counter pain medication if needed, such as acetaminophen or ibuprofen. Read and follow instructions on the label and make sure not to combine other medications that may have same ingredients in it. It is important to not take too much of these ingredients.  3) Drink plenty of caffeine-free fluids. (If you have heart or kidney problems, follow the instructions of your specialist regarding amounts).  4) If you are hungry, eat a bland diet, such as the BRAT diet (bananas, rice, applesauce, toast).  5) Let us know if you are not feeling better in a week.  Covid-19 warning:  Covid-19 is a virus that causes hypoxia (low oxygen level in blood) in some people. If you develop any changes in your usual breathing pattern: difficulty catching your breath, more short winded with activity or with resting, or anything that concerns you about your breathing, do not hesitate to go to the emergency department immediately for evaluation. Covid infection can also affect the way the brain functions if it lacks oxygen, such as, feeling dizzy, passing out, or feeling confused, if you experience any of these symptoms, please do not delay to seek treatment.  Some people experience gastrointestinal problems with Covid, such as vomiting and diarrhea, dehydration is a serious risk and should be avoided. If you are unable to keep liquids down you may need to go to the emergency department for intravenous fluids to avoid dehydration.   Please alert and involve your family and/or friends to help keep an eye on you while you recover from Covid-19. If you have any questions or concerns about your recovery, please do not hesitate to call the office for guidance.

## 2020-08-16 NOTE — Progress Notes (Signed)
Patient ID: Alicia Ross, female    DOB: Nov 07, 1975, 45 y.o.   MRN: [id number]   No chief complaint on file.  Subjective:  CC: covid positive, cough, headache, sinus drainage  This is not a new problem.  Presents today for physical exam after a telephone visit earlier this week.  Reports that Alicia Ross tested positive for Covid 7 days ago, still has sinus drainage, headache and cough.  Alicia Ross was seen by her PCP prior to testing COVID and was prescribed amoxicillin for the sinus infection.  Alicia Ross reports that the pressure from her sinuses is not improved with amoxicillin.  Alicia Ross denies fever however endorses chills, some shortness  of breath with coughing.  The shortness of breath if the reason for the in person visit today.   pt tested positive for covid 7 days ago. sinus drainage and pressure, headache and  Cough. Taking amoxil, motrin.    Medical History Alicia Ross has a past medical history of GERD (gastroesophageal reflux disease) and Headache.   Outpatient Encounter Medications as of 08/16/2020  Medication Sig  . amoxicillin (AMOXIL) 500 MG capsule Take 1 capsule (500 mg total) by mouth 3 (three) times daily.  . cholecalciferol (VITAMIN D3) 25 MCG (1000 UNIT) tablet Take 1,000 Units by mouth daily.  Marland Kitchen loratadine (CLARITIN) 10 MG tablet Take 10 mg by mouth daily.  . predniSONE (DELTASONE) 20 MG tablet Take 1 tablet (20 mg total) by mouth daily with breakfast.  . triamcinolone (NASACORT) 55 MCG/ACT AERO nasal inhaler Place 2 sprays into the nose daily.   . fluticasone (FLOVENT HFA) 44 MCG/ACT inhaler Inhale 2 puffs into the lungs 2 (two) times daily. (Patient not taking: Reported on 08/16/2020)   No facility-administered encounter medications on file as of 08/16/2020.     Review of Systems  Constitutional: Positive for chills. Negative for fever.  HENT: Positive for congestion, postnasal drip, rhinorrhea, sinus pressure and sinus pain.   Respiratory: Positive for cough, chest tightness and shortness  of breath.        Sob with cough  Gastrointestinal: Positive for diarrhea. Negative for abdominal pain, nausea and vomiting.       On amoxicillin  Musculoskeletal: Negative for myalgias.  Skin: Negative for rash.  Neurological: Positive for headaches.       Off and on      Vitals Pulse 79   Temp 97.7 F (36.5 C)   Resp 16   SpO2 97%   Objective:   Physical Exam Vitals reviewed.  Constitutional:      Appearance: Normal appearance.  Cardiovascular:     Rate and Rhythm: Normal rate and regular rhythm.     Heart sounds: Normal heart sounds.  Pulmonary:     Effort: Pulmonary effort is normal.     Breath sounds: Normal breath sounds.  Skin:    General: Skin is warm and dry.  Neurological:     General: No focal deficit present.     Mental Status: Alicia Ross is alert.  Psychiatric:        Behavior: Behavior normal.      Assessment and Plan   1. COVID-19 virus infection - predniSONE (DELTASONE) 20 MG tablet; Take 1 tablet (20 mg total) by mouth daily with breakfast.  Dispense: 7 tablet; Refill: 0  2. Bronchitis - predniSONE (DELTASONE) 20 MG tablet; Take 1 tablet (20 mg total) by mouth daily with breakfast.  Dispense: 7 tablet; Refill: 0  3. Cough in adult - predniSONE (DELTASONE) 20 MG tablet; Take 1  tablet (20 mg total) by mouth daily with breakfast.  Dispense: 7 tablet; Refill: 0   Ongoing cough with Covid infection, will treat with prednisone for 7 days.  Fluticasone inhaler ordered yesterday, pharmacy did not have that in stock, will discontinue today.  Lungs clear, no obvious shortness of breath during visit today, oxygen saturation 97%.  Recommend supportive therapy, adequate hydration, continue over-the-counter medications to treat symptoms.  Instructed to stop amoxicillin, this is not helping her sinus infection, is likely viral in nature due to the Covid infection.  Recommend saline flushes, humidification, to clean out the sinuses.  Alicia Ross will only take Tylenol while  Alicia Ross is on prednisone.  Agrees with plan of care discussed today. Understands warning signs to seek further care: chest pain, shortness of breath, any significant change in health.  Understands to follow-up if symptoms worsen, do not improve.       Pecolia Ades, NP 08/16/2020

## 2020-09-20 ENCOUNTER — Telehealth: Payer: Self-pay

## 2020-09-20 NOTE — Telephone Encounter (Signed)
Last labs completed 02/24/20 Iron,TIBC,Ferritin; Vit D; Hepatic; BMET; Lipid; CBC. Please advise. Thank you

## 2020-09-20 NOTE — Telephone Encounter (Signed)
Pt needs blood work ordered for Phy she also needs Seretin, iron, also need her hormone levels checked   Pt call back [phone]

## 2020-09-21 NOTE — Telephone Encounter (Signed)
Patient states she is still having regular monthly cycles but has been struggling with emotions and anxiety(not like her) and she wants to check to see if it related to pre menopause or thyroid. She would also like a ferritin and iron test

## 2020-09-21 NOTE — Telephone Encounter (Signed)
Please get more information about hormone levels. Is she having cycles? Irregular?

## 2020-09-21 NOTE — Telephone Encounter (Signed)
Left message to return call. Also sent my chart message °

## 2020-09-28 ENCOUNTER — Other Ambulatory Visit: Payer: Self-pay | Admitting: *Deleted

## 2020-09-28 DIAGNOSIS — R5383 Other fatigue: Secondary | ICD-10-CM

## 2020-09-28 DIAGNOSIS — Z8349 Family history of other endocrine, nutritional and metabolic diseases: Secondary | ICD-10-CM

## 2020-09-28 DIAGNOSIS — N920 Excessive and frequent menstruation with regular cycle: Secondary | ICD-10-CM

## 2020-09-28 DIAGNOSIS — Z79899 Other long term (current) drug therapy: Secondary | ICD-10-CM

## 2020-09-28 DIAGNOSIS — Z131 Encounter for screening for diabetes mellitus: Secondary | ICD-10-CM

## 2020-09-28 DIAGNOSIS — Z13220 Encounter for screening for lipoid disorders: Secondary | ICD-10-CM

## 2020-09-28 NOTE — Telephone Encounter (Signed)
Orders given to nurse

## 2020-09-29 LAB — COMPREHENSIVE METABOLIC PANEL
ALT: 17 IU/L (ref 0–32)
AST: 15 IU/L (ref 0–40)
Albumin/Globulin Ratio: 2.1 (ref 1.2–2.2)
Albumin: 4.8 g/dL (ref 3.8–4.8)
Alkaline Phosphatase: 65 IU/L (ref 44–121)
BUN/Creatinine Ratio: 15 (ref 9–23)
BUN: 11 mg/dL (ref 6–24)
Bilirubin Total: 0.4 mg/dL (ref 0.0–1.2)
CO2: 22 mmol/L (ref 20–29)
Calcium: 9.4 mg/dL (ref 8.7–10.2)
Chloride: 102 mmol/L (ref 96–106)
Creatinine, Ser: 0.73 mg/dL (ref 0.57–1.00)
Globulin, Total: 2.3 g/dL (ref 1.5–4.5)
Glucose: 93 mg/dL (ref 65–99)
Potassium: 4.4 mmol/L (ref 3.5–5.2)
Sodium: 139 mmol/L (ref 134–144)
Total Protein: 7.1 g/dL (ref 6.0–8.5)
eGFR: 103 mL/min/{1.73_m2} (ref >59)

## 2020-09-29 LAB — CBC WITH DIFFERENTIAL/PLATELET
Basophils Absolute: 0 10*3/uL (ref 0.0–0.2)
Basos: 0 %
EOS (ABSOLUTE): 0.1 10*3/uL (ref 0.0–0.4)
Eos: 1 %
Hematocrit: 44 % (ref 34.0–46.6)
Hemoglobin: 14.2 g/dL (ref 11.1–15.9)
Immature Grans (Abs): 0 10*3/uL (ref 0.0–0.1)
Immature Granulocytes: 0 %
Lymphocytes Absolute: 1.9 10*3/uL (ref 0.7–3.1)
Lymphs: 34 %
MCH: 31.3 pg (ref 26.6–33.0)
MCHC: 32.3 g/dL (ref 31.5–35.7)
MCV: 97 fL (ref 79–97)
Monocytes Absolute: 0.5 10*3/uL (ref 0.1–0.9)
Monocytes: 9 %
Neutrophils Absolute: 3.2 10*3/uL (ref 1.4–7.0)
Neutrophils: 56 %
Platelets: 216 10*3/uL (ref 150–450)
RBC: 4.54 x10E6/uL (ref 3.77–5.28)
RDW: 12.1 % (ref 11.7–15.4)
WBC: 5.7 10*3/uL (ref 3.4–10.8)

## 2020-09-29 LAB — FERRITIN: Ferritin: 52 ng/mL (ref 15–150)

## 2020-09-29 LAB — LIPID PANEL
Chol/HDL Ratio: 3.5 ratio (ref 0.0–4.4)
Cholesterol, Total: 153 mg/dL (ref 100–199)
HDL: 44 mg/dL (ref >39)
LDL Chol Calc (NIH): 93 mg/dL (ref 0–99)
Triglycerides: 87 mg/dL (ref 0–149)
VLDL Cholesterol Cal: 16 mg/dL (ref 5–40)

## 2020-09-29 LAB — FOLLICLE STIMULATING HORMONE: FSH: 6.5 m[IU]/mL

## 2020-09-29 LAB — IRON: Iron: 75 ug/dL (ref 27–159)

## 2020-09-29 LAB — TSH: TSH: 2.04 u[IU]/mL (ref 0.450–4.500)

## 2020-10-12 ENCOUNTER — Encounter: Payer: Self-pay | Admitting: Nurse Practitioner

## 2020-10-12 ENCOUNTER — Telehealth: Payer: Self-pay | Admitting: *Deleted

## 2020-10-12 ENCOUNTER — Ambulatory Visit: Payer: 59 | Admitting: Nurse Practitioner

## 2020-10-12 ENCOUNTER — Other Ambulatory Visit: Payer: Self-pay

## 2020-10-12 VITALS — BP 128/82 | Ht 65.0 in | Wt 200.4 lb

## 2020-10-12 DIAGNOSIS — Z1211 Encounter for screening for malignant neoplasm of colon: Secondary | ICD-10-CM | POA: Diagnosis not present

## 2020-10-12 DIAGNOSIS — N951 Menopausal and female climacteric states: Secondary | ICD-10-CM

## 2020-10-12 DIAGNOSIS — F419 Anxiety disorder, unspecified: Secondary | ICD-10-CM

## 2020-10-12 DIAGNOSIS — R519 Headache, unspecified: Secondary | ICD-10-CM

## 2020-10-12 DIAGNOSIS — N6310 Unspecified lump in the right breast, unspecified quadrant: Secondary | ICD-10-CM

## 2020-10-12 DIAGNOSIS — G8929 Other chronic pain: Secondary | ICD-10-CM

## 2020-10-12 NOTE — Progress Notes (Signed)
Subjective:    Patient ID: Alicia Ross, female    DOB: May 20, 1976, 45 y.o.   MRN: [id number]  HPI  Patient c/o mood swings, anxiety, increased stress.  She is changing positions at work which she thinks will help her anxiety and stress level.   Walking, especially walking outdoors, helps.  She hasn't tried anything else to manage the stress.    She does have increased headaches as well.  The headaches are chronic.  She feels that they present differently and they vary in intensity.  She does not have an aura.  She does notice that they may be triggered by certain foods or lack of sleep.  The severe headaches are associated with stomach upsets.  She has them often; more than a few times a week.  They are not of sudden onset or of very severe pain.  She has had headaches since high school but never been diagnosed with migraines. She does have photophobia with the headaches.  Motrin does help but she feels she had a rebound headache after taking it often so she has cut back.    She is also concerned about a lump she has had on her R breast that was diagnosed via ultrasound in 2020 as glandular tissue.  She feels it is getting larger.     Review of Systems  Constitutional: Positive for fatigue.  HENT: Negative for congestion.   Eyes: Positive for photophobia.       Photophobia with headaches  Respiratory: Negative for cough, chest tightness and shortness of breath.   Cardiovascular: Negative for chest pain.  Gastrointestinal: Positive for diarrhea and nausea.       Nausea and diarrhea with moderate headaches.  Musculoskeletal: Positive for neck pain.  Neurological: Positive for headaches. Negative for dizziness, seizures, syncope, speech difficulty, weakness, light-headedness and numbness.  Psychiatric/Behavioral: Positive for sleep disturbance. The patient is nervous/anxious.    GAD 7 : Generalized Anxiety Score 10/12/2020 10/12/2020  Nervous, Anxious, on Edge 2 0  Control/stop worrying 1 0   Worry too much - different things 2 0  Trouble relaxing 1 1  Restless 1 1  Easily annoyed or irritable 1 0  Afraid - awful might happen 0 0  Total GAD 7 Score 8 2  Anxiety Difficulty Somewhat difficult Not difficult at all      [place] Visit from 10/12/2020 in [place]  PHQ-9 Total Score 5        Objective:   Physical Exam Vitals and nursing note reviewed. Exam conducted with a chaperone present.  Constitutional:      General: She is not in acute distress. Cardiovascular:     Rate and Rhythm: Normal rate and regular rhythm.     Heart sounds: Normal heart sounds.  Pulmonary:     Breath sounds: Normal breath sounds.  Abdominal:     Palpations: Abdomen is soft.     Comments: Umbilical hernia scar present from recent surgery  Neurological:     Mental Status: She is alert and oriented to person, place, and time.  Psychiatric:        Mood and Affect: Mood normal.        Behavior: Behavior normal.        Thought Content: Thought content normal.        Judgment: Judgment normal.    Today's Vitals   10/12/20 1523  BP: 128/82  Weight: 200 lb 6.4 oz (90.9 kg)  Height: _0  (1.651 m)  Body mass index is 33.35 kg/m.  Results for orders placed or performed in visit on 09/28/20  CBC with Differential/Platelet  Result Value Ref Range   WBC 5.7 3.4 - 10.8 x10E3/uL   RBC 4.54 3.77 - 5.28 x10E6/uL   Hemoglobin 14.2 11.1 - 15.9 g/dL   Hematocrit 44.0 34.0 - 46.6 %   MCV 97 79 - 97 fL   MCH 31.3 26.6 - 33.0 pg   MCHC 32.3 31.5 - 35.7 g/dL   RDW 12.1 11.7 - 15.4 %   Platelets 216 150 - 450 x10E3/uL   Neutrophils 56 Not Estab. %   Lymphs 34 Not Estab. %   Monocytes 9 Not Estab. %   Eos 1 Not Estab. %   Basos 0 Not Estab. %   Neutrophils Absolute 3.2 1.4 - 7.0 x10E3/uL   Lymphocytes Absolute 1.9 0.7 - 3.1 x10E3/uL   Monocytes Absolute 0.5 0.1 - 0.9 x10E3/uL   EOS (ABSOLUTE) 0.1 0.0 - 0.4 x10E3/uL   Basophils Absolute 0.0 0.0 - 0.2 x10E3/uL    Immature Granulocytes 0 Not Estab. %   Immature Grans (Abs) 0.0 0.0 - 0.1 x10E3/uL  Comprehensive metabolic panel  Result Value Ref Range   Glucose 93 65 - 99 mg/dL   BUN 11 6 - 24 mg/dL   Creatinine, Ser 0.73 0.57 - 1.00 mg/dL   eGFR 103 >59 mL/min/1.73   BUN/Creatinine Ratio 15 9 - 23   Sodium 139 134 - 144 mmol/L   Potassium 4.4 3.5 - 5.2 mmol/L   Chloride 102 96 - 106 mmol/L   CO2 22 20 - 29 mmol/L   Calcium 9.4 8.7 - 10.2 mg/dL   Total Protein 7.1 6.0 - 8.5 g/dL   Albumin 4.8 3.8 - 4.8 g/dL   Globulin, Total 2.3 1.5 - 4.5 g/dL   Albumin/Globulin Ratio 2.1 1.2 - 2.2   Bilirubin Total 0.4 0.0 - 1.2 mg/dL   Alkaline Phosphatase 65 44 - 121 IU/L   AST 15 0 - 40 IU/L   ALT 17 0 - 32 IU/L  Lipid panel  Result Value Ref Range   Cholesterol, Total 153 100 - 199 mg/dL   Triglycerides 87 0 - 149 mg/dL   HDL 44 >39 mg/dL   VLDL Cholesterol Cal 16 5 - 40 mg/dL   LDL Chol Calc (NIH) 93 0 - 99 mg/dL   Chol/HDL Ratio 3.5 0.0 - 4.4 ratio  TSH  Result Value Ref Range   TSH 2.040 0.450 - 4.500 uIU/mL  FSH  Result Value Ref Range   FSH 6.5 mIU/mL  Ferritin  Result Value Ref Range   Ferritin 52 15 - 150 ng/mL  Iron  Result Value Ref Range   Iron 75 27 - 159 ug/dL        Assessment & Plan:   Problem List Items Addressed This Visit   None   Visit Diagnoses    Chronic nonintractable headache, unspecified headache type    -  Primary   Breast mass, right       Relevant Orders   MS DIGITAL DIAG TOMO BILAT   Screen for colon cancer       Relevant Orders   Ambulatory referral to Gastroenterology   Perimenopause       Mild anxiety          Patient is agreeable to keeping a headache diary.  She will keep track of symptoms and potential triggers and report back in 4-6 weeks. Given a form for this  and recommended some apps she can use on her phone. Reviewed warning signs for headaches.    She prefers to avoid medication at this time for both anxiety and headaches as she  would rather try natural remedies first.  Recommended St. John's Wort OTC as directed to patient.  Will follow-up at return.  Patient educated and in agreement that she should seek urgent medical care if she develops symptoms such as but not limited to sudden, severe headache, numbness, tingling, difficulty speaking, lightheadedness.   Diagnostic mammogram for recheck.  Return in about 4 weeks (around 11/09/2020) for follow-up on headaches.

## 2020-10-12 NOTE — Progress Notes (Signed)
   Subjective:    Patient ID: Alicia Ross, female    DOB: Mar 08, 1976, 45 y.o.   MRN: [id number]  HPI  Patient arrives to discuss results of recent labs and hormones. Patient also having troubles with emotions and anxiety   Review of Systems     Objective:   Physical Exam        Assessment & Plan:

## 2020-10-12 NOTE — Telephone Encounter (Signed)
Need to schedule diagnostic mammo for patient at breast center-(Breast center closed)

## 2020-10-12 NOTE — Patient Instructions (Addendum)
Keep a headache diary.  Can use an app if needed.   Track:  Where pain is in your head (one side, both sides, forehead, neck?) What it feels like (dull, throbbing) Foods eaten, amount of sleep Weather Stress level etc. Visual changes? Part of menstrual cycle  Other triggers

## 2020-10-13 ENCOUNTER — Encounter: Payer: Self-pay | Admitting: Nurse Practitioner

## 2020-10-15 ENCOUNTER — Other Ambulatory Visit: Payer: Self-pay | Admitting: Nurse Practitioner

## 2020-10-15 DIAGNOSIS — N6310 Unspecified lump in the right breast, unspecified quadrant: Secondary | ICD-10-CM

## 2020-10-15 NOTE — Telephone Encounter (Signed)
Patient notified and verbalized understanding. 

## 2020-10-15 NOTE — Telephone Encounter (Signed)
Tried to call breast center and their computers were frozen. Was asked to call back.  Their phone number [phone]

## 2020-10-15 NOTE — Telephone Encounter (Signed)
Diagnostic mammo scheduled at the [facility] May 12 at 9:20am

## 2020-10-15 NOTE — Telephone Encounter (Signed)
Left message to return call to notify patient.

## 2020-11-09 ENCOUNTER — Ambulatory Visit: Payer: 59 | Admitting: Nurse Practitioner

## 2020-11-09 ENCOUNTER — Other Ambulatory Visit: Payer: Self-pay

## 2020-11-09 VITALS — BP 119/76 | HR 67 | Temp 97.5°F | Ht 65.0 in | Wt 200.0 lb

## 2020-11-09 DIAGNOSIS — M62838 Other muscle spasm: Secondary | ICD-10-CM | POA: Diagnosis not present

## 2020-11-09 DIAGNOSIS — R519 Headache, unspecified: Secondary | ICD-10-CM

## 2020-11-09 DIAGNOSIS — G8929 Other chronic pain: Secondary | ICD-10-CM | POA: Diagnosis not present

## 2020-11-09 DIAGNOSIS — G479 Sleep disorder, unspecified: Secondary | ICD-10-CM

## 2020-11-09 NOTE — Progress Notes (Signed)
   Subjective:    Patient ID: Alicia Ross, female    DOB: 13-Jun-1976, 45 y.o.   MRN: [id number]  HPI Presents for recheck on headaches. Has brought her journal with her today. No association with foods. The two triggers identified were tight neck and upper back muscles particularly in the mornings. Another factor was seasonal pollen and weather change. Has seen some relief with saline nasal spray. Continues to work on weight loss and healthy habits.  Had a massage earlier today which has helped.  Depression screen [facility] 2/9 11/09/2020 10/12/2020 08/09/2020 02/25/2019  Decreased Interest 0 0 0 0  Down, Depressed, Hopeless 0 1 0 0  PHQ - 2 Score 0 1 0 0  Altered sleeping 0 2 - -  Tired, decreased energy 0 1 - -  Change in appetite 0 0 - -  Feeling bad or failure about yourself  0 1 - -  Trouble concentrating 0 0 - -  Moving slowly or fidgety/restless 0 0 - -  Suicidal thoughts 0 0 - -  PHQ-9 Score 0 5 - -  Difficult doing work/chores Not difficult at all Not difficult at all - -        Objective:   Physical Exam NAD. Alert, oriented. Lungs clear. Heart RRR.  Today's Vitals   11/09/20 1317  BP: 119/76  Pulse: 67  Temp: (!) 97.5 F (36.4 C)  SpO2: 99%  Weight: 200 lb (90.7 kg)  Height: 5\' 5"  (1.651 m)   Body mass index is 33.28 kg/m.       Assessment & Plan:   Problem List Items Addressed This Visit      Other   Sleep disturbance    Other Visit Diagnoses    Muscle spasms of neck    -  Primary   Chronic nonintractable headache, unspecified headache type         Main headaches most likely muscle contraction/tension headaches Continue massage therapy. Discussed stress reduction and sleep issues. Defers medication at this time.  Return for Follow up in August for PE. Call back sooner if needed.

## 2020-11-10 ENCOUNTER — Encounter: Payer: Self-pay | Admitting: Nurse Practitioner

## 2020-11-10 DIAGNOSIS — G479 Sleep disorder, unspecified: Secondary | ICD-10-CM | POA: Insufficient documentation

## 2020-11-21 ENCOUNTER — Ambulatory Visit
Admission: RE | Admit: 2020-11-21 | Discharge: 2020-11-21 | Disposition: A | Discharge status: Home / Self Care | Payer: 59 | Source: Ambulatory Visit | Attending: Nurse Practitioner | Admitting: Nurse Practitioner

## 2020-11-21 ENCOUNTER — Other Ambulatory Visit: Payer: Self-pay

## 2020-11-21 DIAGNOSIS — N6310 Unspecified lump in the right breast, unspecified quadrant: Secondary | ICD-10-CM

## 2020-11-22 ENCOUNTER — Other Ambulatory Visit: Payer: 59

## 2020-12-04 ENCOUNTER — Ambulatory Visit: Payer: 59 | Admitting: Physician Assistant

## 2020-12-20 ENCOUNTER — Encounter: Payer: Self-pay | Admitting: Physician Assistant

## 2020-12-20 ENCOUNTER — Other Ambulatory Visit: Payer: Self-pay

## 2020-12-20 ENCOUNTER — Ambulatory Visit: Payer: 59 | Admitting: Physician Assistant

## 2020-12-20 DIAGNOSIS — Z808 Family history of malignant neoplasm of other organs or systems: Secondary | ICD-10-CM | POA: Diagnosis not present

## 2020-12-20 DIAGNOSIS — L820 Inflamed seborrheic keratosis: Secondary | ICD-10-CM | POA: Diagnosis not present

## 2020-12-20 DIAGNOSIS — Z1283 Encounter for screening for malignant neoplasm of skin: Secondary | ICD-10-CM

## 2020-12-20 DIAGNOSIS — D225 Melanocytic nevi of trunk: Secondary | ICD-10-CM | POA: Diagnosis not present

## 2020-12-20 DIAGNOSIS — D485 Neoplasm of uncertain behavior of skin: Secondary | ICD-10-CM

## 2020-12-20 NOTE — Progress Notes (Addendum)
   Follow-Up Visit   Subjective  Alicia Ross is a 45 y.o. female who presents for the following: Annual Exam (Patient here today for yearly skin check. Per patient she has a lesion on her abdomen, wrist, and between her breast x years per patient the lesions have all changed. No personal history of atypical moles, melanoma or  non mole skin cancer. Per patient her mother did have melanoma. No family history of non mole skin cancer. ).   The following portions of the chart were reviewed this encounter and updated as appropriate:  Allergies  Meds  Problems  Med Hx  Surg Hx  Fam Hx       Objective  Well appearing patient in no apparent distress; mood and affect are within normal limits.  A full examination was performed including scalp, head, eyes, ears, nose, lips, neck, chest, axillae, abdomen, back, buttocks, bilateral upper extremities, bilateral lower extremities, hands, feet, fingers, toes, fingernails, and toenails. All findings within normal limits unless otherwise noted below.  Right Malar Cheek Brown crust on a pink base  Chest - Medial (Center) Pink papule on an erythematous base   Assessment & Plan  Seborrheic keratosis, inflamed Right Malar Cheek  Destruction of lesion - Right Malar Cheek Complexity: simple   Destruction method: cryotherapy   Informed consent: discussed and consent obtained   Timeout:  patient name, date of birth, surgical site, and procedure verified Lesion destroyed using liquid nitrogen: Yes   Cryotherapy cycles:  3 Outcome: patient tolerated procedure well with no complications    Neoplasm of uncertain behavior of skin Chest - Medial (Center)  Skin / nail biopsy Type of biopsy: tangential   Informed consent: discussed and consent obtained   Timeout: patient name, date of birth, surgical site, and procedure verified   Procedure prep:  Patient was prepped and draped in usual sterile fashion (Non sterile) Prep type:   Chlorhexidine Anesthesia: the lesion was anesthetized in a standard fashion   Anesthetic:  1% lidocaine w/ epinephrine 1-100,000 local infiltration Instrument used: flexible razor blade   Outcome: patient tolerated procedure well   Post-procedure details: wound care instructions given    Specimen 1 - Surgical pathology Differential Diagnosis: r/o skin tag  Check Margins: No    I, Jazzalynn Rhudy, PA-C, have reviewed all documentation's for this visit.  The documentation on 12/20/20 for the exam, diagnosis, procedures and orders are all accurate and complete.

## 2021-03-15 ENCOUNTER — Ambulatory Visit (INDEPENDENT_AMBULATORY_CARE_PROVIDER_SITE_OTHER): Payer: 59 | Admitting: Nurse Practitioner

## 2021-03-15 ENCOUNTER — Encounter: Payer: Self-pay | Admitting: Nurse Practitioner

## 2021-03-15 ENCOUNTER — Other Ambulatory Visit: Payer: Self-pay

## 2021-03-15 VITALS — BP 120/68 | HR 91 | Temp 97.3°F | Ht 65.0 in | Wt 206.0 lb

## 2021-03-15 DIAGNOSIS — Z1211 Encounter for screening for malignant neoplasm of colon: Secondary | ICD-10-CM | POA: Diagnosis not present

## 2021-03-15 DIAGNOSIS — R922 Inconclusive mammogram: Secondary | ICD-10-CM

## 2021-03-15 DIAGNOSIS — Z1239 Encounter for other screening for malignant neoplasm of breast: Secondary | ICD-10-CM

## 2021-03-15 DIAGNOSIS — Z01411 Encounter for gynecological examination (general) (routine) with abnormal findings: Secondary | ICD-10-CM | POA: Diagnosis not present

## 2021-03-15 DIAGNOSIS — B3731 Acute candidiasis of vulva and vagina: Secondary | ICD-10-CM

## 2021-03-15 DIAGNOSIS — B373 Candidiasis of vulva and vagina: Secondary | ICD-10-CM

## 2021-03-15 DIAGNOSIS — Z01419 Encounter for gynecological examination (general) (routine) without abnormal findings: Secondary | ICD-10-CM

## 2021-03-15 DIAGNOSIS — N951 Menopausal and female climacteric states: Secondary | ICD-10-CM

## 2021-03-15 DIAGNOSIS — N939 Abnormal uterine and vaginal bleeding, unspecified: Secondary | ICD-10-CM

## 2021-03-15 MED ORDER — FLUCONAZOLE 150 MG PO TABS: ORAL_TABLET | ORAL | 0 refills | Status: DC

## 2021-03-15 NOTE — Progress Notes (Signed)
Subjective:    Patient ID: Alicia Ross, female    DOB: January 21, 1976, 45 y.o.   MRN: [id number]  HPI Patient presents today for physical. She denies any accidents or falls within the last 6 months. She last had mammogram 12/2020 which was normal. Her last colonoscopy was in her 20's for bloody stools related to hemorrhoids. She needs a referral for colonoscopy. She feels her health is good and follows healthy diet. She enjoys walking and doing exercise videos. She denies drinking and smoking. Eye exam and dental exam are UTD. She is having an increase in daily headaches described as sharp pains only on the left side of the head. She is seeing a chiropractor for needed adjustments and muscle tension in her shoulders. States he recommends some labs and tests based on findings from her xrays of her spine at his office but does not have specific notes today.  Her cycles are twice a month and she is experiencing some vaginal itching with no discharge noticed. Same sexual partner. She declines COVID and FLU vaccines.    Review of Systems  Constitutional:  Negative for chills, fatigue and fever.  HENT:  Negative for sore throat and trouble swallowing.   Respiratory:  Negative for cough, chest tightness, shortness of breath and wheezing.   Cardiovascular:  Positive for palpitations. Negative for chest pain.       Occasional brief palpitations.   Gastrointestinal:  Negative for abdominal distention, abdominal pain, blood in stool, constipation, diarrhea, nausea and vomiting.  Genitourinary:  Positive for menstrual problem. Negative for difficulty urinating, dysuria, enuresis, frequency, genital sores, pelvic pain, urgency and vaginal discharge.       Mild vaginal itching.   Neurological:  Positive for headaches. Negative for dizziness, facial asymmetry, weakness, light-headedness and numbness.  Depression screen [facility] 2/9 03/15/2021  Decreased Interest 0  Down, Depressed, Hopeless 0  PHQ - 2 Score 0   Altered sleeping -  Tired, decreased energy -  Change in appetite -  Feeling bad or failure about yourself  -  Trouble concentrating -  Moving slowly or fidgety/restless -  Suicidal thoughts -  PHQ-9 Score -  Difficult doing work/chores -       Objective:   Physical Exam Vitals and nursing note reviewed. Exam conducted with a chaperone present.  Constitutional:      General: She is not in acute distress.    Appearance: Normal appearance.  Neck:     Comments: Thyroid soft and palpable. No tenderness, lumps or nodules felt during exam. No adenopathy noted.  Cardiovascular:     Rate and Rhythm: Normal rate and regular rhythm.     Pulses: Normal pulses.     Heart sounds: Normal heart sounds. No murmur heard. Pulmonary:     Effort: Pulmonary effort is normal.     Breath sounds: Normal breath sounds.  Chest:  Breasts:    Right: Normal. No swelling, bleeding, inverted nipple, mass, skin change or tenderness.     Left: Normal. No swelling, bleeding, inverted nipple, mass, skin change or tenderness.  Abdominal:     General: There is no distension.     Palpations: Abdomen is soft. There is no mass.     Tenderness: There is no abdominal tenderness.  Genitourinary:    General: Normal vulva.     Exam position: Lithotomy position.     Labia:        Right: No rash, tenderness or lesion.        Left:  No rash, tenderness or lesion.      Comments: External genitalia: no lesions or eruptions noted. Vagina and cervix show no lesions, inflammation, discharge or tenderness. No masses or tenderness noted during bimanual exam but limited to abdominal girth.  Musculoskeletal:     Cervical back: Neck supple.  Lymphadenopathy:     Upper Body:     Right upper body: No supraclavicular, axillary or pectoral adenopathy.     Left upper body: No supraclavicular, axillary or pectoral adenopathy.  Skin:    General: Skin is warm and dry.  Neurological:     Mental Status: She is alert and oriented to  person, place, and time.  Psychiatric:        Mood and Affect: Mood normal.        Behavior: Behavior normal.        Thought Content: Thought content normal.        Judgment: Judgment normal.   .. Vitals:   03/15/21 1326  BP: 120/68  Pulse: 91  Temp: (!) 97.3 F (36.3 C)  Height: '5\' 5"'$  (1.651 m)  Weight: 206 lb (93.4 kg)  SpO2: 97%  BMI (Calculated): 34.28   [place] Personal lifetime risk for breast cancer 38.20%     Assessment & Plan:   Problem List Items Addressed This Visit       Genitourinary   Abnormal uterine bleeding     Other   Breast cancer screening, high risk patient   Dense breasts   Perimenopause   Other Visit Diagnoses     Well woman exam    -  Primary   Screen for colon cancer       Relevant Orders   Ambulatory referral to Gastroenterology   Vaginal candidiasis       Relevant Medications   fluconazole (DIFLUCAN) 150 MG tablet        Meds ordered this encounter  Medications   fluconazole (DIFLUCAN) 150 MG tablet    Sig: One po qd prn yeast infection; may repeat in 3-4 days if needed    Dispense:  2 tablet    Refill:  0    Order Specific Question:   Supervising Provider    Answer:   Sallee Lange A [[id number]]   Trial of Diflucan for vaginal itching. Call back if persists.  Note sent to nurse to order MRI of both breasts due to density and high risk screening for breast cancer.  Defers medication for irregular cycles.  Encouraged continued healthy diet, regular exercise and weight loss efforts.  Recommend OV to further discuss headaches and cycles. Return in about 1 year (around 03/15/2022) for physical.

## 2021-03-15 NOTE — Progress Notes (Signed)
   Subjective:    Patient ID: Alicia Ross, female    DOB: [birth year], 45 y.o.   MRN: [id number]  HPI The patient comes in today for a wellness visit.    A review of their health history was completed.  A review of medications was also completed.  Any needed refills; no  Eating habits:   Falls/  MVA accidents in past few months: no  Regular exercise: walking, other video exercises  Specialist pt sees on regular basis: no  Preventative health issues were discussed.   Additional concerns: headaches   Review of Systems     Objective:   Physical Exam        Assessment & Plan:

## 2021-03-15 NOTE — Patient Instructions (Addendum)
We will refer for colonoscopy. 

## 2021-03-16 ENCOUNTER — Encounter: Payer: Self-pay | Admitting: Nurse Practitioner

## 2021-03-17 DIAGNOSIS — N939 Abnormal uterine and vaginal bleeding, unspecified: Secondary | ICD-10-CM | POA: Insufficient documentation

## 2021-03-17 DIAGNOSIS — Z1239 Encounter for other screening for malignant neoplasm of breast: Secondary | ICD-10-CM | POA: Insufficient documentation

## 2021-03-17 DIAGNOSIS — R9230 Dense breasts, unspecified: Secondary | ICD-10-CM | POA: Insufficient documentation

## 2021-03-17 DIAGNOSIS — R922 Inconclusive mammogram: Secondary | ICD-10-CM | POA: Insufficient documentation

## 2021-03-17 DIAGNOSIS — N951 Menopausal and female climacteric states: Secondary | ICD-10-CM | POA: Insufficient documentation

## 2021-03-19 ENCOUNTER — Telehealth: Payer: Self-pay | Admitting: *Deleted

## 2021-03-19 DIAGNOSIS — N6310 Unspecified lump in the right breast, unspecified quadrant: Secondary | ICD-10-CM

## 2021-03-19 DIAGNOSIS — R922 Inconclusive mammogram: Secondary | ICD-10-CM

## 2021-03-19 DIAGNOSIS — Z1239 Encounter for other screening for malignant neoplasm of breast: Secondary | ICD-10-CM

## 2021-03-19 NOTE — Telephone Encounter (Signed)
Per Hoyle Sauer NP:   Please schedule MRI of both breasts. High risk based on [place] breast cancer screening of lifetime risk >38%. Breast density class D. Thanks.

## 2021-03-19 NOTE — Telephone Encounter (Signed)
MRI breast ordered in Epic.

## 2021-03-20 ENCOUNTER — Encounter: Payer: Self-pay | Admitting: Internal Medicine

## 2021-04-20 ENCOUNTER — Other Ambulatory Visit: Payer: Self-pay | Admitting: Nurse Practitioner

## 2021-04-20 ENCOUNTER — Encounter: Payer: Self-pay | Admitting: Nurse Practitioner

## 2021-04-20 DIAGNOSIS — R922 Inconclusive mammogram: Secondary | ICD-10-CM

## 2021-04-20 DIAGNOSIS — Z1239 Encounter for other screening for malignant neoplasm of breast: Secondary | ICD-10-CM

## 2021-04-22 ENCOUNTER — Telehealth: Payer: Self-pay | Admitting: Genetic Counselor

## 2021-04-22 NOTE — Telephone Encounter (Signed)
Scheduled appt per 10/7 referral. Pt is aware of appt date and time.

## 2021-04-30 ENCOUNTER — Ambulatory Visit: Payer: 59

## 2021-05-01 ENCOUNTER — Encounter: Payer: Self-pay | Admitting: Genetic Counselor

## 2021-05-01 ENCOUNTER — Inpatient Hospital Stay: Payer: 59

## 2021-05-01 ENCOUNTER — Other Ambulatory Visit: Payer: Self-pay | Admitting: Genetic Counselor

## 2021-05-01 ENCOUNTER — Other Ambulatory Visit: Payer: Self-pay

## 2021-05-01 ENCOUNTER — Inpatient Hospital Stay: Payer: 59 | Attending: Genetic Counselor | Admitting: Genetic Counselor

## 2021-05-01 DIAGNOSIS — R922 Inconclusive mammogram: Secondary | ICD-10-CM

## 2021-05-01 DIAGNOSIS — Z800 Family history of malignant neoplasm of digestive organs: Secondary | ICD-10-CM

## 2021-05-01 DIAGNOSIS — R9230 Dense breasts, unspecified: Secondary | ICD-10-CM

## 2021-05-01 DIAGNOSIS — Z803 Family history of malignant neoplasm of breast: Secondary | ICD-10-CM

## 2021-05-01 DIAGNOSIS — Z8049 Family history of malignant neoplasm of other genital organs: Secondary | ICD-10-CM

## 2021-05-01 DIAGNOSIS — Z808 Family history of malignant neoplasm of other organs or systems: Secondary | ICD-10-CM

## 2021-05-01 LAB — GENETIC SCREENING ORDER

## 2021-05-01 NOTE — Progress Notes (Signed)
REFERRING PROVIDER: Nilda Simmer, NP [address],  [place] [address]  PRIMARY PROVIDER:  Erven Colla, DO  PRIMARY REASON FOR VISIT:  1. Family history of breast cancer   2. Family history of melanoma   3. Family history of pancreatic cancer   4. Family history of uterine cancer      HISTORY OF PRESENT ILLNESS:   Ms. Cohenour, a 45 y.o. female, was seen for a [place] cancer genetics consultation at the request of Dr. Sumner Boast due to a family history of cancer.  Ms. Angst presents to clinic today to discuss the possibility of a hereditary predisposition to cancer, genetic testing, and to further clarify her future cancer risks, as well as potential cancer risks for family members.   Ms. Cahn is a 45 y.o. female with no personal history of cancer.  She reports having very dense breasts which concerns her for her risk for breast cancer.  She also reports having many colon polyps when she was 2 that were removed.  She has not had colon polyps on subsequent colonoscopies. 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  CANCER HISTORY:  Oncology History   No history exists.     RISK FACTORS:  Menarche was at age 24.  First live birth at age 65.  OCP use for approximately  6-7  years.  Ovaries intact: yes.  Hysterectomy: no.  Menopausal status: premenopausal.  HRT use: 0 years. Colonoscopy: yes;  multiple polyps at age 62 . Mammogram within the last year: yes. Number of breast biopsies: 0. Up to date with pelvic exams: yes. Any excessive radiation exposure in the past: no  Past Medical History:  Diagnosis Date   Family history of breast cancer    Family history of melanoma    Family history of pancreatic cancer    Family history of uterine cancer    GERD (gastroesophageal reflux disease)    Headache     Past Surgical History:  Procedure Laterality Date   CESAREAN SECTION  01/ 2008    [place] N/A 06/27/2020   Procedure: HERNIA REPAIR INCISIONAL;OPEN WITH MESH;  Surgeon: [name], MD;  Location: [facility];  Service: General;  Laterality: N/A;   KNEE ARTHROSCOPY Left 2006   LAPAROSCOPIC TUBAL LIGATION Bilateral 08/25/2014   Procedure: LAPAROSCOPIC TUBAL LIGATION with Filshie Clips;  Surgeon: [name], MD;  Location: [facility];  Service: Gynecology;  Laterality: Bilateral;   WISDOM TOOTH EXTRACTION  1995   WRIST GANGLION EXCISION Left 1994  & 2004   in 2004  also removed spurs    Social History   Socioeconomic History   Marital status: Married    Spouse name: Not on file   Number of children: Not on file   Years of education: Not on file   Highest education level: Not on file  Occupational History   Not on file  Tobacco Use   Smoking status: Never   Smokeless tobacco: Never  Vaping Use   Vaping Use: Never used  Substance and Sexual Activity   Alcohol use: No   Drug use: No   Sexual activity: Yes  Other Topics Concern   Not on file  Social History Narrative   Not on file   Social Determinants of Health   Financial Resource Strain: Not on file  Food Insecurity: Not on file  Transportation Needs: Not on file  Physical Activity: Not on file  Stress: Not on file  Social Connections: Not on file     FAMILY HISTORY:  We obtained a detailed, 4-generation family history.  Significant diagnoses are listed below: Family History  Problem Relation Age of Onset   Melanoma Mother 28   Uterine cancer Mother 60   Hemochromatosis Father    Pancreatic cancer Maternal Uncle 17   Breast cancer Paternal Aunt 72   Arthritis Maternal Grandmother    Hemochromatosis Paternal Grandmother    Breast cancer Paternal Grandmother 101   Breast cancer Other 9   Breast cancer Other    Hemochromatosis Cousin        pat first cousin    The patient has one son who is cancer free.  She has a brother and sister who are cancer free.   Both parents are living.  The patient's mother had uterine cancer at 13 and melanoma at 62.  She has one brother who died of pancreatic cancer at 18.  Her parents are deceased from non-cancer related issues.  The patient's father was diagnosed with Hemochromatosis and currently has monthly blood draws.  He has two sisters, one who had breast cancer at 52 and died at 59.  She has one son with HFE.  The paternal grandparents are deceased.  The grandmother had breast cancer at 39 and also had HFE.  The grandfather had two sisters with breast cancer, one who is living at 44.  Ms. Crosby is unaware of previous family history of genetic testing for hereditary cancer risks. Patient's maternal ancestors are of [place] descent, and paternal ancestors are of [place] descent. There is no reported Ashkenazi Jewish ancestry. There is no known consanguinity.  GENETIC COUNSELING ASSESSMENT: Ms. Palen is a 45 y.o. female with a personal and family history of cancer which is somewhat suggestive of a hereditary cancer syndrome and predisposition to cancer given the combination of cancers and young ages of onset. We, therefore, discussed and recommended the following at today's visit.   DISCUSSION: We discussed that, in general, most cancer is not inherited in families, but instead is sporadic or familial. Sporadic cancers occur by chance and typically happen at older ages (>50 years) as this type of cancer is caused by genetic changes acquired during an individual's lifetime. Some families have more cancers than would be expected by chance; however, the ages or types of cancer are not consistent with a known genetic mutation or known genetic mutations have been ruled out. This type of familial cancer is thought to be due to a combination of multiple genetic, environmental, hormonal, and lifestyle factors. While this combination of factors likely increases the risk of cancer, the exact source of this risk is not  currently identifiable or testable.  Looking at the family history of cancer, she has bilineal risk for cancer.  On the maternal side of the family there is an increased risk for CDKN2A mutations based on the melanoma and pancreatic cancer, as well as Lynch syndrome due to the young uterine cancer in her mother and the pancreatic cancer in her uncle.  On the patient's father's side of the family there is a risk for hereditary breast cancer syndromes based on the number of women in the family with breast cancer combined with the young age of onset of breast cancer in her aunt.  We discussed that each cancer has its risk for being hereditary.  About 5 - 10% of breast cancer is hereditary, with most cases  associated with BRCA mutations.  Melanoma's risk for being hereditary is also about 5-10% with the greatest risk due to CDKN2A mutations.  Uterine cancer has a hereditary risk of about 5-10% of cases, with most due to Lynch syndrome.  There are other genes that can be associated with hereditary breast or uterine cancer as well as melanoma syndromes.  A large cancer panel will help test for all of these conditions. We discussed that testing is beneficial for several reasons including knowing how to follow individuals and understand if other family members could be at risk for cancer and allow them to undergo genetic testing.   We reviewed the characteristics, features and inheritance patterns of hereditary cancer syndromes. We also discussed genetic testing, including the appropriate family members to test, the process of testing, insurance coverage and turn-around-time for results. We discussed the implications of a negative, positive, carrier and/or variant of uncertain significant result. We recommended Ms. Alarid pursue genetic testing for the CustomNext-Cancer+RNAinsight gene panel.   The CustomNext-Cancer gene panel offered by [facility] and includes sequencing and rearrangement analysis for the  following 91 genes: AIP, ALK, APC*, ATM*, AXIN2, BAP1, BARD1, BLM, BMPR1A, BRCA1*, BRCA2*, BRIP1*, CDC73, CDH1*, CDK4, CDKN1B, CDKN2A, CHEK2*, CTNNA1, DICER1, FANCC, FH, FLCN, GALNT12, KIF1B, LZTR1, MAX, MEN1, MET, MLH1*, MRE11A, MSH2*, MSH3, MSH6*, MUTYH*, NBN, NF1*, NF2, NTHL1, PALB2*, PHOX2B, PMS2*, POT1, PRKAR1A, PTCH1, PTEN*, RAD50, RAD51C*, RAD51D*, RB1, RECQL, RET, SDHA, SDHAF2, SDHB, SDHC, SDHD, SMAD4, SMARCA4, SMARCB1, SMARCE1, STK11, SUFU, TMEM127, TP53*, TSC1, TSC2, VHL and XRCC2 (sequencing and deletion/duplication); CASR, CFTR, CPA1, CTRC, EGFR, EGLN1, FAM175A, HOXB13, KIT, MITF, MLH3, PALLD, PDGFRA, POLD1, POLE, PRSS1, RINT1, RPS20, SPINK1 and TERT (sequencing only); EPCAM and GREM1 (deletion/duplication only). DNA and RNA analyses performed for * genes.   We discussed that some people do not want to undergo genetic testing due to fear of genetic discrimination.  A federal law called the Genetic Information Non-Discrimination Act (GINA) of 2008 helps protect individuals against genetic discrimination based on their genetic test results.  It impacts both health insurance and employment.  With health insurance, it protects against increased premiums, being kicked off insurance or being forced to take a test in order to be insured.  For employment it protects against hiring, firing and promoting decisions based on genetic test results.  Health status due to a cancer diagnosis is not protected under GINA.   Based on Ms. Defrancesco's family history of cancer, she meets medical criteria for genetic testing. Despite that she meets criteria, she may still have an out of pocket cost. We discussed that if her out of pocket cost for testing is over $100, the laboratory will call and confirm whether she wants to proceed with testing.  If the out of pocket cost of testing is less than $100 she will be billed by the genetic testing laboratory.   Based on the patient's family history, a statistical model (Tyrer  Cusik) was used to estimate her risk of developing breast cancer. This estimates her lifetime risk of developing breast cancer to be approximately 37.6%. This estimation does not consider any genetic testing results.  The patient's lifetime breast cancer risk is a preliminary estimate based on available information using one of several models endorsed by the [place] (ACS). The ACS recommends consideration of breast MRI screening as an adjunct to mammography for patients at high risk (defined as 20% or greater lifetime risk). Please note that a woman's breast cancer risk changes over time. It may increase  or decrease based on age and any changes to the personal and/or family medical history. The risks and recommendations listed above apply to this patient at this point in time. In the future, she may or may not be eligible for the same medical management strategies and, in some cases, other medical management strategies may become available to her. If she is interested in an updated breast cancer risk assessment at a later date, she can contact us.  Ms. Wantz has been determined to be at high risk for breast cancer.  Therefore, we recommend that annual screening with mammography and breast MRI be performed.  We discussed that Ms. Brix should discuss her individual situation with her referring physician and determine a breast cancer screening plan with which they are both comfortable.  We can also refer her to the high risk breast clinic after her test results are back.    Lastly, we discussed Hemochromatosis.  Hemochromatosis is an autosomal recessive condition that is associated with increased level of iron which can cause several health issues.  Men tend to show increased risk for health issues earlier than women due to women menstruating.  After menopause, women are at increased risk for elevated iron levels.  Ms. Dingee reports having elevated iron levels.  She also reports being tested  for HFE when she was in high school.  We discussed the potential for being retested to confirm that she truly is a carrier, as the carrier frequency is high.  About 1 in 9 individuals is a carrier for HFE, and ultimately she has a 1 in 18 chance of being affected with HFE.  PLAN: After considering the risks, benefits, and limitations, Ms. Simerly provided informed consent to pursue genetic testing and the blood sample was sent to [facility] for analysis of the CustomNext-Cancer+RNAinsight. Results should be available within approximately 2-3 weeks' time, at which point they will be disclosed by telephone to Ms. Fonseca, as will any additional recommendations warranted by these results. Ms. Doty will receive a summary of her genetic counseling visit and a copy of her results once available. This information will also be available in Epic.   Lastly, we encouraged Ms. Wahba to remain in contact with cancer genetics annually so that we can continuously update the family history and inform her of any changes in cancer genetics and testing that may be of benefit for this family.   Ms. Mickle questions were answered to her satisfaction today. Our contact information was provided should additional questions or concerns arise. Thank you for the referral and allowing [place] to share in the care of your patient.   Eoin Willden P. Florene Glen, [place], [facility] Licensed, Insurance risk surveyor [email or web address] phone: [phone]  The patient was seen for a total of 40 minutes in face-to-face genetic counseling.  The patient was seen alone.  This patient was discussed with Drs. Magrinat, Lindi Adie and/or Burr Medico who agrees with the above.    _______________________________________________________________________ For Office Staff:  Number of people involved in session: 1 Was an Intern/ student involved with case: no\

## 2021-05-16 ENCOUNTER — Telehealth: Payer: Self-pay | Admitting: Genetic Counselor

## 2021-05-16 ENCOUNTER — Encounter: Payer: Self-pay | Admitting: Genetic Counselor

## 2021-05-16 DIAGNOSIS — Z1379 Encounter for other screening for genetic and chromosomal anomalies: Secondary | ICD-10-CM | POA: Insufficient documentation

## 2021-05-16 NOTE — Telephone Encounter (Signed)
LM on VM that results are back and to please call. 

## 2021-05-20 ENCOUNTER — Other Ambulatory Visit: Payer: Self-pay

## 2021-05-20 ENCOUNTER — Ambulatory Visit: Payer: Self-pay | Admitting: Genetic Counselor

## 2021-05-20 ENCOUNTER — Ambulatory Visit (INDEPENDENT_AMBULATORY_CARE_PROVIDER_SITE_OTHER): Payer: Self-pay | Admitting: *Deleted

## 2021-05-20 ENCOUNTER — Encounter: Payer: Self-pay | Admitting: *Deleted

## 2021-05-20 VITALS — Ht 65.0 in | Wt 211.6 lb

## 2021-05-20 DIAGNOSIS — Z1211 Encounter for screening for malignant neoplasm of colon: Secondary | ICD-10-CM

## 2021-05-20 DIAGNOSIS — Z1379 Encounter for other screening for genetic and chromosomal anomalies: Secondary | ICD-10-CM

## 2021-05-20 MED ORDER — NA SULFATE-K SULFATE-MG SULF 17.5-3.13-1.6 GM/177ML PO SOLN: 1.0000 | Freq: Once | ORAL | 0 refills | Status: AC

## 2021-05-20 NOTE — Progress Notes (Addendum)
Gastroenterology Pre-Procedure Review  Request Date: 05/20/2021 Requesting Physician: Pearson Forster, NP @ [facility], Last TCS 26 years ago done in [place], [place] due to blood in her stool, findings per pt: hemorrhoids, sensitive to certain foods   PATIENT REVIEW QUESTIONS: The patient responded to the following health history questions as indicated:    1. Diabetes Melitis: no 2. Joint replacements in the past 12 months: no 3. Major health problems in the past 3 months: no 4. Has an artificial valve or MVP: no 5. Has a defibrillator: no 6. Has been advised in past to take antibiotics in advance of a procedure like teeth cleaning: no 7. Family history of colon cancer: yes, paternal grandmother: age 86's, uncle: mid 75's 8. Alcohol Use: no 9. Illicit drug Use: no 10. History of sleep apnea: no  11. History of coronary artery or other vascular stents placed within the last 12 months: no 12. History of any prior anesthesia complications: no 13. Body mass index is 35.21 kg/m.    MEDICATIONS & ALLERGIES:    Patient reports the following regarding taking any blood thinners:   Plavix? no Aspirin? no Coumadin? no Brilinta? no Xarelto? no Eliquis? no Pradaxa? no Savaysa? no Effient? No  Patient confirms/reports the following medications:  Current Outpatient Medications  Medication Sig Dispense Refill   cholecalciferol (VITAMIN D3) 25 MCG (1000 UNIT) tablet Take 1,000 Units by mouth daily.     loratadine (CLARITIN) 10 MG tablet Take 10 mg by mouth daily.     Multiple Vitamins-Minerals (MULTIVITAMIN WOMEN PO) Take by mouth daily at 6 (six) AM.     OVER THE COUNTER MEDICATION daily at 6 (six) AM. GLA complex- supplement- vit E and Linoleic acid     No current facility-administered medications for this visit.    Patient confirms/reports the following allergies:  Allergies  Allergen Reactions   Codeine Other (See Comments)    GI UPSET   Colace [Docusate]     No  orders of the defined types were placed in this encounter.   AUTHORIZATION INFORMATION Primary Insurance: [facility],  [place] #: [id number],  Group #: [id number] Pre-Cert / Auth required: Yes, approved online [id number] Pre-Cert / Josem Kaufmann #: [id number]  SCHEDULE INFORMATION: Procedure has been scheduled as follows:  Date: 06/19/2021, Time: 7:30  Location: [facility] with Dr. Gala Romney  This Gastroenterology Pre-Precedure Review Form is being routed to the following provider(s): Aliene Altes, PA-C

## 2021-05-20 NOTE — Progress Notes (Signed)
Pt says there is some Vit E in the GLA complex multivitamin.  Would she need to hold for 10 days?

## 2021-05-20 NOTE — Progress Notes (Signed)
OK to schedule. ASA II  Hold supplements that contain vitamin E or iron x 10 days prior to procedure.

## 2021-05-20 NOTE — Telephone Encounter (Signed)
Revealed negative genetic testing.  Discussed that we do not know why she has cancer in the family. It could be due to a different gene that we are not testing, or maybe our current technology may not be able to pick something up.  It will be important for her to keep in contact with genetics to keep up with whether additional testing may be needed.  Patient is considered to be at high risk, approximately 37.6% lifetime risk.  We will refer to the high risk breast clinic.

## 2021-05-20 NOTE — Progress Notes (Signed)
HPI:  Alicia Ross was previously seen in the [facility] due to a family history of breast and other cancers and concerns regarding a hereditary predisposition to cancer. Please refer to our prior cancer genetics clinic note for more information regarding our discussion, assessment and recommendations, at the time. Alicia Ross recent genetic test results were disclosed to her, as were recommendations warranted by these results. These results and recommendations are discussed in more detail below.  CANCER HISTORY:  Oncology History   No history exists.    FAMILY HISTORY:  We obtained a detailed, 4-generation family history.  Significant diagnoses are listed below: Family History  Problem Relation Age of Onset   Melanoma Mother 45   Uterine cancer Mother 79   Hemochromatosis Father    Pancreatic cancer Maternal Uncle 8   Breast cancer Paternal Aunt 71   Arthritis Maternal Grandmother    Hemochromatosis Paternal Grandmother    Breast cancer Paternal Grandmother 68   Breast cancer Other 15   Breast cancer Other    Hemochromatosis Cousin        pat first cousin       The patient has one son who is cancer free.  She has a brother and sister who are cancer free.  Both parents are living.   The patient's mother had uterine cancer at 90 and melanoma at 23.  She has one brother who died of pancreatic cancer at 40.  Her parents are deceased from non-cancer related issues.   The patient's father was diagnosed with Hemochromatosis and currently has monthly blood draws.  He has two sisters, one who had breast cancer at 77 and died at 49.  She has one son with HFE.  The paternal grandparents are deceased.  The grandmother had breast cancer at 40 and also had HFE.  The grandfather had two sisters with breast cancer, one who is living at 74.   Alicia Ross is unaware of previous family history of genetic testing for hereditary cancer risks. Patient's maternal ancestors are of  [place] descent, and paternal ancestors are of [place] descent. There is no reported Ashkenazi Jewish ancestry. There is no known consanguinity  GENETIC TEST RESULTS: Genetic testing reported out on May 12, 2021 through the CustomNext-cancer+RNAinsight panel found no pathogenic mutations. The CustomNext-Cancer gene panel offered by [facility] and includes sequencing and rearrangement analysis for the following 91 genes: AIP, ALK, APC*, ATM*, AXIN2, BAP1, BARD1, BLM, BMPR1A, BRCA1*, BRCA2*, BRIP1*, CDC73, CDH1*, CDK4, CDKN1B, CDKN2A, CHEK2*, CTNNA1, DICER1, FANCC, FH, FLCN, GALNT12, KIF1B, LZTR1, MAX, MEN1, MET, MLH1*, MRE11A, MSH2*, MSH3, MSH6*, MUTYH*, NBN, NF1*, NF2, NTHL1, PALB2*, PHOX2B, PMS2*, POT1, PRKAR1A, PTCH1, PTEN*, RAD50, RAD51C*, RAD51D*, RB1, RECQL, RET, SDHA, SDHAF2, SDHB, SDHC, SDHD, SMAD4, SMARCA4, SMARCB1, SMARCE1, STK11, SUFU, TMEM127, TP53*, TSC1, TSC2, VHL and XRCC2 (sequencing and deletion/duplication); CASR, CFTR, CPA1, CTRC, EGFR, EGLN1, FAM175A, HOXB13, KIT, MITF, MLH3, PALLD, PDGFRA, POLD1, POLE, PRSS1, RINT1, RPS20, SPINK1 and TERT (sequencing only); EPCAM and GREM1 (deletion/duplication only). DNA and RNA analyses performed for * genes. The test report has been scanned into EPIC and is located under the Molecular Pathology section of the Results Review tab.  A portion of the result report is included below for reference.     We discussed with Alicia Ross that because current genetic testing is not perfect, it is possible there may be a gene mutation in one of these genes that current testing cannot detect, but that chance is small.  We also discussed,  that there could be another gene that has not yet been discovered, or that we have not yet tested, that is responsible for the cancer diagnoses in the family. It is also possible there is a hereditary cause for the cancer in the family that Alicia Ross did not inherit and therefore was not identified in her testing.  Therefore,  it is important to remain in touch with cancer genetics in the future so that we can continue to offer Alicia Ross the most up to date genetic testing.   ADDITIONAL GENETIC TESTING: We discussed with Alicia Ross that her genetic testing was fairly extensive.  If there are genes identified to increase cancer risk that can be analyzed in the future, we would be happy to discuss and coordinate this testing at that time.    CANCER SCREENING RECOMMENDATIONS: Alicia Ross test result is considered negative (normal).  This means that we have not identified a hereditary cause for her family history of breast and other cancers at this time. Most cancers happen by chance and this negative test suggests that her cancer may fall into this category.    While reassuring, this does not definitively rule out a hereditary predisposition to cancer. It is still possible that there could be genetic mutations that are undetectable by current technology. There could be genetic mutations in genes that have not been tested or identified to increase cancer risk.  Therefore, it is recommended she continue to follow the cancer management and screening guidelines provided by her primary healthcare provider.   An individual's cancer risk and medical management are not determined by genetic test results alone. Overall cancer risk assessment incorporates additional factors, including personal medical history, family history, and any available genetic information that may result in a personalized plan for cancer prevention and surveillance  Based on Alicia Ross's family of cancer, as well as her genetic test results, statistical models (Tyrer Cusik)  and literature data were used to estimate her risk of developing breast cancer. This estimates her lifetime risk of developing breast cancer to be approximately 37.6%.  The patient's lifetime breast cancer risk is a preliminary estimate based on available information using one of several models  endorsed by the [employer]  (NCCN).  The NCCN recommends consideration of breast MRI screening as an adjunct to mammography for patients at high risk (defined as 20% or greater lifetime risk). Please note that a woman's breast cancer risk changes over time. It may increase or decrease based on age and any changes to the personal and/or family medical history. The risks and recommendations listed above apply to this patient at this point in time. In the future, she may or may not be eligible for the same medical management strategies and, in some cases, other medical management strategies may become available to her. If she is interested in an updated breast cancer risk assessment at a later date, she can contact us.   Alicia Ross has been determined to be at high risk for breast cancer. Her Tyrer-Cuzick risk score is 37.6%.  For women with a greater than 20% lifetime risk of breast cancer, the [employer]  (NCCN) recommends the following:   1.      Clinical encounter every 6-12 months to begin when identified as being at increased risk, but not before age 29  2.      Annual mammograms. Tomosynthesis is recommended starting 10 years earlier than the youngest breast cancer diagnosis in the family or at  age 39 (whichever comes first), but not before age 50    3.      Annual breast MRI starting 10 years earlier than the youngest breast cancer diagnosis in the family or at age 40 (whichever comes first), but not before age 25.   We, therefore, discussed that it is reasonable for Alicia Ross to be followed by a high-risk breast cancer clinic; in addition to a yearly mammogram and physical exam by a healthcare provider.  We have referred her to the high risk breast clinic at [facility].  RECOMMENDATIONS FOR FAMILY MEMBERS:  Individuals in this family might be at some increased risk of developing cancer, over the general population risk, simply due to the  family history of cancer.  We recommended women in this family have a yearly mammogram beginning at age 71, or 60 years younger than the earliest onset of cancer, an annual clinical breast exam, and perform monthly breast self-exams. Women in this family should also have a gynecological exam as recommended by their primary provider. All family members should be referred for colonoscopy starting at age 13.  It is also possible there is a hereditary cause for the cancer in Alicia Ross's family that she did not inherit and therefore was not identified in her.  Based on Alicia Ross's family history, we recommended her siblings, who have not been diagnosed with cancer, have genetic counseling and testing. Alicia Ross will let us know if we can be of any assistance in coordinating genetic counseling and/or testing for this family member.   FOLLOW-UP: Lastly, we discussed with Alicia Ross that cancer genetics is a rapidly advancing field and it is possible that new genetic tests will be appropriate for her and/or her family members in the future. We encouraged her to remain in contact with cancer genetics on an annual basis so we can update her personal and family histories and let her know of advances in cancer genetics that may benefit this family.   Our contact number was provided. Alicia Ross questions were answered to her satisfaction, and she knows she is welcome to call us at anytime with additional questions or concerns.   Roma Kayser, [place], [facility] Licensed, Certified Genetic Counselor [email or web address]

## 2021-05-21 ENCOUNTER — Telehealth: Payer: Self-pay | Admitting: Hematology and Oncology

## 2021-05-21 NOTE — Telephone Encounter (Signed)
Scheduled appt per 11/7 referral. Pt is aware of appt date and time.

## 2021-05-23 ENCOUNTER — Other Ambulatory Visit: Payer: Self-pay | Admitting: *Deleted

## 2021-05-23 ENCOUNTER — Encounter: Payer: Self-pay | Admitting: *Deleted

## 2021-05-23 NOTE — Progress Notes (Addendum)
Pt informed to hold supplements that contain vitamin E or iron x 10 days prior to procedure. Mailed letter to pt about medication adjustments as well.

## 2021-05-24 NOTE — Progress Notes (Signed)
Called [facility] and spoke to Sam.  Informed me that colonoscopies are covered starting at age 46.  REF#: [id number]

## 2021-05-25 ENCOUNTER — Encounter: Payer: Self-pay | Admitting: Nurse Practitioner

## 2021-06-11 ENCOUNTER — Telehealth: Payer: Self-pay | Admitting: *Deleted

## 2021-06-11 NOTE — Telephone Encounter (Signed)
Patient called in needing to r/s procedure

## 2021-06-12 ENCOUNTER — Telehealth: Payer: Self-pay

## 2021-06-12 ENCOUNTER — Telehealth: Payer: Self-pay | Admitting: Hematology and Oncology

## 2021-06-12 NOTE — Telephone Encounter (Signed)
Lmom for pt to call me back.  Informed Carolyn in Endo.

## 2021-06-12 NOTE — Telephone Encounter (Signed)
Pt returned your call. She advised me she was at work.

## 2021-06-12 NOTE — Telephone Encounter (Signed)
Spoke to pt.  She informed me that Feb would probably be best for reschedule.  Informed her that I would call her back once Feb procedure schedule is available.

## 2021-06-12 NOTE — Telephone Encounter (Signed)
Spoke to pt.  See other phone note.

## 2021-06-12 NOTE — Progress Notes (Incomplete)
[facility] CONSULT NOTE  Patient Care Team: Erven Colla, DO as PCP - General (Family Medicine) [place], Ronalee Red, PA-C as Physician Assistant (Dermatology)  CHIEF COMPLAINTS/PURPOSE OF CONSULTATION:  Newly diagnosed high risk of breast cancer  HISTORY OF PRESENTING ILLNESS:  Alicia Ross 45 y.o. female is here because of recent diagnosis of high risk of breast cancer. She received genetic testing on 11/07 due to her family history of cancer which showed her life time risk of developing breast cancer to be approximately 37.6%. Mammogram on 11/21/2020 showed no evidence of malignancy. She presents to the clinic today for initial evaluation and discussion of treatment options.   I reviewed her records extensively and collaborated the history with the patient.  MEDICAL HISTORY:  Past Medical History:  Diagnosis Date   Family history of breast cancer    Family history of melanoma    Family history of pancreatic cancer    Family history of uterine cancer    GERD (gastroesophageal reflux disease)    Headache     SURGICAL HISTORY: Past Surgical History:  Procedure Laterality Date   CESAREAN SECTION  01/ 2008   INCISIONAL HERNIA REPAIR N/A 06/27/2020   Procedure: HERNIA REPAIR INCISIONAL;OPEN WITH MESH;  Surgeon: [name], MD;  Location: [facility];  Service: General;  Laterality: N/A;   KNEE ARTHROSCOPY Left 2006   LAPAROSCOPIC TUBAL LIGATION Bilateral 08/25/2014   Procedure: LAPAROSCOPIC TUBAL LIGATION with Filshie Clips;  Surgeon: [name], MD;  Location: [facility];  Service: Gynecology;  Laterality: Bilateral;   WISDOM TOOTH EXTRACTION  1995   WRIST GANGLION EXCISION Left 1994  & 2004   in 2004  also removed spurs    SOCIAL HISTORY: Social History   Socioeconomic History   Marital status: Married    Spouse name: Not on file   Number of children: Not on file   Years of education: Not on file   Highest education level: Not  on file  Occupational History   Not on file  Tobacco Use   Smoking status: Never   Smokeless tobacco: Never  Vaping Use   Vaping Use: Never used  Substance and Sexual Activity   Alcohol use: No   Drug use: No   Sexual activity: Yes  Other Topics Concern   Not on file  Social History Narrative   Not on file   Social Determinants of Health   Financial Resource Strain: Not on file  Food Insecurity: Not on file  Transportation Needs: Not on file  Physical Activity: Not on file  Stress: Not on file  Social Connections: Not on file  Intimate Partner Violence: Not on file    FAMILY HISTORY: Family History  Problem Relation Age of Onset   Melanoma Mother 64   Uterine cancer Mother 36   Hemochromatosis Father    Pancreatic cancer Maternal Uncle 27   Breast cancer Paternal Aunt 28   Arthritis Maternal Grandmother    Hemochromatosis Paternal Grandmother    Breast cancer Paternal Grandmother 85   Breast cancer Other 61   Breast cancer Other    Hemochromatosis Cousin        pat first cousin    ALLERGIES:  is allergic to codeine and colace [docusate].  MEDICATIONS:  Current Outpatient Medications  Medication Sig Dispense Refill   acetaminophen (TYLENOL) 500 MG tablet Take 500 mg by mouth as needed.     cholecalciferol (VITAMIN D3) 25 MCG (1000 UNIT) tablet Take 1,000 Units  by mouth daily.     loratadine (CLARITIN) 10 MG tablet Take 10 mg by mouth daily.     Multiple Vitamins-Minerals (MULTIVITAMIN WOMEN PO) Take by mouth daily at 6 (six) AM.     OVER THE COUNTER MEDICATION daily at 6 (six) AM. Shaklee GLA complex- supplement- vit E and Linoleic acid     No current facility-administered medications for this visit.    REVIEW OF SYSTEMS:   Constitutional: Denies fevers, chills or abnormal night sweats Eyes: Denies blurriness of vision, double vision or watery eyes Ears, nose, mouth, throat, and face: Denies mucositis or sore throat Respiratory: Denies cough, dyspnea or  wheezes Cardiovascular: Denies palpitation, chest discomfort or lower extremity swelling Gastrointestinal:  Denies nausea, heartburn or change in bowel habits Skin: Denies abnormal skin rashes Lymphatics: Denies new lymphadenopathy or easy bruising Neurological:Denies numbness, tingling or new weaknesses Behavioral/Psych: Mood is stable, no new changes  Breast: *** Denies any palpable lumps or discharge All other systems were reviewed with the patient and are negative.  PHYSICAL EXAMINATION: ECOG PERFORMANCE STATUS: {CHL ONC ECOG PS:[id number]}  There were no vitals filed for this visit. There were no vitals filed for this visit.  GENERAL:alert, no distress and comfortable SKIN: skin color, texture, turgor are normal, no rashes or significant lesions EYES: normal, conjunctiva are pink and non-injected, sclera clear OROPHARYNX:no exudate, no erythema and lips, buccal mucosa, and tongue normal  NECK: supple, thyroid normal size, non-tender, without nodularity LYMPH:  no palpable lymphadenopathy in the cervical, axillary or inguinal LUNGS: clear to auscultation and percussion with normal breathing effort HEART: regular rate & rhythm and no murmurs and no lower extremity edema ABDOMEN:abdomen soft, non-tender and normal bowel sounds Musculoskeletal:no cyanosis of digits and no clubbing  PSYCH: alert & oriented x 3 with fluent speech NEURO: no focal motor/sensory deficits BREAST:*** No palpable nodules in breast. No palpable axillary or supraclavicular lymphadenopathy (exam performed in the presence of a chaperone)   LABORATORY DATA:  I have reviewed the data as listed Lab Results  Component Value Date   WBC 5.7 09/28/2020   HGB 14.2 09/28/2020   HCT 44.0 09/28/2020   MCV 97 09/28/2020   PLT 216 09/28/2020   Lab Results  Component Value Date   NA 139 09/28/2020   K 4.4 09/28/2020   CL 102 09/28/2020   CO2 22 09/28/2020    RADIOGRAPHIC STUDIES: I have personally reviewed  the radiological reports and agreed with the findings in the report.  ASSESSMENT AND PLAN:  No problem-specific Assessment & Plan notes found for this encounter.   All questions were answered. The patient knows to call the clinic with any problems, questions or concerns.   Rulon Eisenmenger, MD, MPH 06/12/2021    I, Thana Ates, am acting as scribe for Nicholas Lose, MD.  {Add scribe attestation statement}

## 2021-06-12 NOTE — Telephone Encounter (Signed)
Pt had called in requesting to cancel her appt with Dr. Lindi Adie on 12/1. I cancelled the appt and called pt. I left her a vm letting her know I had cancelled the appt and she can call back to r/s.

## 2021-06-13 ENCOUNTER — Inpatient Hospital Stay: Payer: 59 | Admitting: Hematology and Oncology

## 2021-06-19 ENCOUNTER — Ambulatory Visit (HOSPITAL_COMMUNITY): Admission: RE | Admit: 2021-06-19 | Payer: 59 | Source: Home / Self Care | Admitting: Internal Medicine

## 2021-06-19 ENCOUNTER — Encounter (HOSPITAL_COMMUNITY): Admission: RE | Payer: Self-pay | Source: Home / Self Care

## 2021-06-19 SURGERY — COLONOSCOPY
Anesthesia: Moderate Sedation

## 2021-06-27 NOTE — Telephone Encounter (Signed)
Lmom for pt to call me back. 

## 2021-07-02 ENCOUNTER — Encounter: Payer: Self-pay | Admitting: *Deleted

## 2021-07-02 NOTE — Telephone Encounter (Signed)
Lmom for pt to call me back.  Mailed letter to pt. ?

## 2021-07-09 ENCOUNTER — Other Ambulatory Visit: Payer: Self-pay | Admitting: Nurse Practitioner

## 2021-07-09 DIAGNOSIS — Z1239 Encounter for other screening for malignant neoplasm of breast: Secondary | ICD-10-CM

## 2021-10-11 ENCOUNTER — Telehealth: Payer: Self-pay | Admitting: Nurse Practitioner

## 2021-10-11 DIAGNOSIS — Z79899 Other long term (current) drug therapy: Secondary | ICD-10-CM

## 2021-10-11 DIAGNOSIS — R5383 Other fatigue: Secondary | ICD-10-CM

## 2021-10-11 DIAGNOSIS — Z1329 Encounter for screening for other suspected endocrine disorder: Secondary | ICD-10-CM

## 2021-10-11 DIAGNOSIS — Z0000 Encounter for general adult medical examination without abnormal findings: Secondary | ICD-10-CM

## 2021-10-11 NOTE — Telephone Encounter (Signed)
Patient has physical on 4/28 and needing labs  ?

## 2021-10-11 NOTE — Telephone Encounter (Signed)
Last labs completed on 09/28/20 Iron, Ferritin, FSH, TSH, Lipid, CMP and CBC. Please advise. Thank you ?

## 2021-10-11 NOTE — Telephone Encounter (Signed)
Lab order entered and pt is aware ?

## 2021-10-11 NOTE — Telephone Encounter (Signed)
Pt would like to add iron and ferritin. Please advise. Thank you ?

## 2021-11-05 LAB — CBC WITH DIFFERENTIAL/PLATELET
Basophils Absolute: 0 10*3/uL (ref 0.0–0.2)
Basos: 0 %
EOS (ABSOLUTE): 0.1 10*3/uL (ref 0.0–0.4)
Eos: 1 %
Hematocrit: 45.3 % (ref 34.0–46.6)
Hemoglobin: 15.2 g/dL (ref 11.1–15.9)
Immature Grans (Abs): 0 10*3/uL (ref 0.0–0.1)
Immature Granulocytes: 0 %
Lymphocytes Absolute: 3.2 10*3/uL — ABNORMAL HIGH (ref 0.7–3.1)
Lymphs: 43 %
MCH: 31.5 pg (ref 26.6–33.0)
MCHC: 33.6 g/dL (ref 31.5–35.7)
MCV: 94 fL (ref 79–97)
Monocytes Absolute: 0.6 10*3/uL (ref 0.1–0.9)
Monocytes: 8 %
Neutrophils Absolute: 3.5 10*3/uL (ref 1.4–7.0)
Neutrophils: 48 %
Platelets: 247 10*3/uL (ref 150–450)
RBC: 4.83 x10E6/uL (ref 3.77–5.28)
RDW: 12.1 % (ref 11.7–15.4)
WBC: 7.4 10*3/uL (ref 3.4–10.8)

## 2021-11-05 LAB — COMPREHENSIVE METABOLIC PANEL
ALT: 21 IU/L (ref 0–32)
AST: 19 IU/L (ref 0–40)
Albumin/Globulin Ratio: 1.8 (ref 1.2–2.2)
Albumin: 4.8 g/dL (ref 3.8–4.8)
Alkaline Phosphatase: 71 IU/L (ref 44–121)
BUN/Creatinine Ratio: 20 (ref 9–23)
BUN: 13 mg/dL (ref 6–24)
Bilirubin Total: 0.6 mg/dL (ref 0.0–1.2)
CO2: 24 mmol/L (ref 20–29)
Calcium: 9.7 mg/dL (ref 8.7–10.2)
Chloride: 101 mmol/L (ref 96–106)
Creatinine, Ser: 0.65 mg/dL (ref 0.57–1.00)
Globulin, Total: 2.7 g/dL (ref 1.5–4.5)
Glucose: 93 mg/dL (ref 70–99)
Potassium: 4.3 mmol/L (ref 3.5–5.2)
Sodium: 140 mmol/L (ref 134–144)
Total Protein: 7.5 g/dL (ref 6.0–8.5)
eGFR: 110 mL/min/{1.73_m2} (ref >59)

## 2021-11-05 LAB — LIPID PANEL
Chol/HDL Ratio: 4.9 ratio — ABNORMAL HIGH (ref 0.0–4.4)
Cholesterol, Total: 193 mg/dL (ref 100–199)
HDL: 39 mg/dL — ABNORMAL LOW (ref >39)
LDL Chol Calc (NIH): 113 mg/dL — ABNORMAL HIGH (ref 0–99)
Triglycerides: 234 mg/dL — ABNORMAL HIGH (ref 0–149)
VLDL Cholesterol Cal: 41 mg/dL — ABNORMAL HIGH (ref 5–40)

## 2021-11-05 LAB — IRON AND TIBC
Iron Saturation: 45 % (ref 15–55)
Iron: 140 ug/dL (ref 27–159)
Total Iron Binding Capacity: 309 ug/dL (ref 250–450)
UIBC: 169 ug/dL (ref 131–425)

## 2021-11-05 LAB — FERRITIN: Ferritin: 119 ng/mL (ref 15–150)

## 2021-11-05 LAB — TSH: TSH: 3.51 u[IU]/mL (ref 0.450–4.500)

## 2021-11-08 ENCOUNTER — Ambulatory Visit (INDEPENDENT_AMBULATORY_CARE_PROVIDER_SITE_OTHER): Payer: 59 | Admitting: Nurse Practitioner

## 2021-11-08 VITALS — BP 121/80 | HR 80 | Temp 97.5°F | Ht 65.0 in | Wt 209.0 lb

## 2021-11-08 DIAGNOSIS — N920 Excessive and frequent menstruation with regular cycle: Secondary | ICD-10-CM

## 2021-11-08 DIAGNOSIS — R0683 Snoring: Secondary | ICD-10-CM

## 2021-11-08 DIAGNOSIS — Z124 Encounter for screening for malignant neoplasm of cervix: Secondary | ICD-10-CM | POA: Diagnosis not present

## 2021-11-08 DIAGNOSIS — N951 Menopausal and female climacteric states: Secondary | ICD-10-CM | POA: Diagnosis not present

## 2021-11-08 DIAGNOSIS — Z1151 Encounter for screening for human papillomavirus (HPV): Secondary | ICD-10-CM

## 2021-11-08 DIAGNOSIS — Z01419 Encounter for gynecological examination (general) (routine) without abnormal findings: Secondary | ICD-10-CM

## 2021-11-08 DIAGNOSIS — R5383 Other fatigue: Secondary | ICD-10-CM

## 2021-11-08 NOTE — Patient Instructions (Signed)
Tyrer Cusick score runs 37% (well above 20 % lifetime risk) ?See if radiologist recommends MRI due to breast density. If they can put this recommendation on the mammogram report it will help [place] get insurance coverage.  ?

## 2021-11-08 NOTE — Progress Notes (Signed)
? ?Subjective:  ? ? Patient ID: Alicia Ross, female    DOB: 06-Oct-1975, 46 y.o.   MRN: [id number] ? ?HPI ?The patient comes in today for a wellness visit. ? ? ? ?A review of their health history was completed. ? A review of medications was also completed. ? ?Any needed refills; none ? ?Eating habits: good ? ?Falls/  MVA accidents in past few months: no   ? ?Regular exercise: walk 3 to 4 x week ? ?Specialist pt sees on regular basis: eye , dentist ? ?Preventative health issues were discussed.  ? ?Additional concerns:physical  form completed  ?Has mammogram scheduled in May.  ?Had appointment with GI for screening colonoscopy a few months ago but had to cancel due to illness; plans to do this sometime in the future ?Same sexual partner. Regular cycles very heavy lasting 5-7 days.  ?Under significant stress related to work.  ?According to her husband, she has started snoring. ? ?  11/08/2021  ?  1:12 PM  ?Depression screen PHQ 2/9  ?Decreased Interest 0  ?Down, Depressed, Hopeless 0  ?PHQ - 2 Score 0  ? ? ? ?Review of Systems  ?Constitutional:  Positive for fatigue. Negative for activity change and appetite change.  ?HENT:  Negative for sore throat and trouble swallowing.   ?Respiratory:  Negative for cough, chest tightness, shortness of breath and wheezing.   ?Cardiovascular:  Negative for chest pain.  ?Gastrointestinal:  Negative for abdominal distention, abdominal pain, constipation, diarrhea, nausea and vomiting.  ?Genitourinary:  Positive for menstrual problem. Negative for difficulty urinating, dysuria, enuresis, frequency, genital sores, pelvic pain, urgency and vaginal discharge.  ? ?   ?Objective:  ? Physical Exam ?Constitutional:   ?   General: She is not in acute distress. ?   Appearance: She is well-developed.  ?Neck:  ?   Thyroid: No thyromegaly.  ?   Trachea: No tracheal deviation.  ?   Comments: Thyroid non tender to palpation. No mass or goiter noted.  ?Cardiovascular:  ?   Rate and Rhythm: Normal rate  and regular rhythm.  ?   Heart sounds: Normal heart sounds. No murmur heard. ?Pulmonary:  ?   Effort: Pulmonary effort is normal.  ?   Breath sounds: Normal breath sounds.  ?Chest:  ?Breasts: ?   Right: No swelling, inverted nipple, mass, skin change or tenderness.  ?   Left: No swelling, inverted nipple, mass, skin change or tenderness.  ?Abdominal:  ?   General: There is no distension.  ?   Palpations: Abdomen is soft.  ?   Tenderness: There is no abdominal tenderness.  ?Genitourinary: ?   Comments: External GU: no rashes or lesions. Vagina: no discharge. Cervix normal in appearance. No CMT. Bimanual exam: no tenderness or obvious masses.  ?Musculoskeletal:  ?   Cervical back: Normal range of motion and neck supple.  ?Lymphadenopathy:  ?   Cervical: No cervical adenopathy.  ?   Upper Body:  ?   Right upper body: No supraclavicular, axillary or pectoral adenopathy.  ?   Left upper body: No supraclavicular, axillary or pectoral adenopathy.  ?Skin: ?   General: Skin is warm and dry.  ?   Findings: No rash.  ?Neurological:  ?   Mental Status: She is alert and oriented to person, place, and time.  ?Psychiatric:     ?   Mood and Affect: Mood normal.     ?   Behavior: Behavior normal.     ?  Thought Content: Thought content normal.     ?   Judgment: Judgment normal.  ? ?Today's Vitals  ? 11/08/21 1310  ?BP: 121/80  ?Pulse: 80  ?Temp: (!) 97.5 ?F (36.4 ?C)  ?SpO2: 98%  ?Weight: 209 lb (94.8 kg)  ?Height: $RemoveB'5\' 5"'TzHJnoSl$  (1.651 m)  ? ?Body mass index is 34.78 kg/m?Marland Kitchen ?Results for orders placed or performed in visit on 10/11/21  ?Iron Binding Cap (TIBC)([facility])  ?Result Value Ref Range  ? Total Iron Binding Capacity 309 250 - 450 ug/dL  ? UIBC 169 131 - 425 ug/dL  ? Iron 140 27 - 159 ug/dL  ? Iron Saturation 45 15 - 55 %  ?Ferritin  ?Result Value Ref Range  ? Ferritin 119 15 - 150 ng/mL  ?CBC with Differential  ?Result Value Ref Range  ? WBC 7.4 3.4 - 10.8 x10E3/uL  ? RBC 4.83 3.77 - 5.28 x10E6/uL  ? Hemoglobin 15.2 11.1 -  15.9 g/dL  ? Hematocrit 45.3 34.0 - 46.6 %  ? MCV 94 79 - 97 fL  ? MCH 31.5 26.6 - 33.0 pg  ? MCHC 33.6 31.5 - 35.7 g/dL  ? RDW 12.1 11.7 - 15.4 %  ? Platelets 247 150 - 450 x10E3/uL  ? Neutrophils 48 Not Estab. %  ? Lymphs 43 Not Estab. %  ? Monocytes 8 Not Estab. %  ? Eos 1 Not Estab. %  ? Basos 0 Not Estab. %  ? Neutrophils Absolute 3.5 1.4 - 7.0 x10E3/uL  ? Lymphocytes Absolute 3.2 (H) 0.7 - 3.1 x10E3/uL  ? Monocytes Absolute 0.6 0.1 - 0.9 x10E3/uL  ? EOS (ABSOLUTE) 0.1 0.0 - 0.4 x10E3/uL  ? Basophils Absolute 0.0 0.0 - 0.2 x10E3/uL  ? Immature Granulocytes 0 Not Estab. %  ? Immature Grans (Abs) 0.0 0.0 - 0.1 x10E3/uL  ?Comprehensive Metabolic Panel (CMET)  ?Result Value Ref Range  ? Glucose 93 70 - 99 mg/dL  ? BUN 13 6 - 24 mg/dL  ? Creatinine, Ser 0.65 0.57 - 1.00 mg/dL  ? eGFR 110 >59 mL/min/1.73  ? BUN/Creatinine Ratio 20 9 - 23  ? Sodium 140 134 - 144 mmol/L  ? Potassium 4.3 3.5 - 5.2 mmol/L  ? Chloride 101 96 - 106 mmol/L  ? CO2 24 20 - 29 mmol/L  ? Calcium 9.7 8.7 - 10.2 mg/dL  ? Total Protein 7.5 6.0 - 8.5 g/dL  ? Albumin 4.8 3.8 - 4.8 g/dL  ? Globulin, Total 2.7 1.5 - 4.5 g/dL  ? Albumin/Globulin Ratio 1.8 1.2 - 2.2  ? Bilirubin Total 0.6 0.0 - 1.2 mg/dL  ? Alkaline Phosphatase 71 44 - 121 IU/L  ? AST 19 0 - 40 IU/L  ? ALT 21 0 - 32 IU/L  ?Lipid Profile  ?Result Value Ref Range  ? Cholesterol, Total 193 100 - 199 mg/dL  ? Triglycerides 234 (H) 0 - 149 mg/dL  ? HDL 39 (L) >39 mg/dL  ? VLDL Cholesterol Cal 41 (H) 5 - 40 mg/dL  ? LDL Chol Calc (NIH) 113 (H) 0 - 99 mg/dL  ? Chol/HDL Ratio 4.9 (H) 0.0 - 4.4 ratio  ?TSH  ?Result Value Ref Range  ? TSH 3.510 0.450 - 4.500 uIU/mL  ? ?Reviewed labs with patient.  ? ? ? ? ?   ?Assessment & Plan:  ? ?Problem List Items Addressed This Visit   ? ?  ? Other  ? Menorrhagia with regular cycle  ? Perimenopause  ? ?Other Visit Diagnoses   ? ? Well  woman exam    -  Primary  ? Relevant Orders  ? IGP, Aptima HPV  ? Screening for HPV (human papillomavirus)      ? Relevant  Orders  ? IGP, Aptima HPV  ? Screening for cervical cancer      ? Relevant Orders  ? IGP, Aptima HPV  ? Fatigue, unspecified type      ? Snoring      ? ?  ? ?Mailed a [place] questionnaire to complete and send back to the office to screen for OSA.  ?Discussed use of hormonal therapy to help with cycles. Defers at this time.  ?Encouraged patient to go back to healthy habits from a year ago for her cholesterol.  ?Defers medication at this time for stress.  ?Recommend rescheduling appointment for colonoscopy. ?Given note for radiology regarding breast cancer screening.  ?Return in about 1 year (around 11/09/2022) for physical. ?Call back sooner if needed.  ? ? ? ?

## 2021-11-09 ENCOUNTER — Encounter: Payer: Self-pay | Admitting: Nurse Practitioner

## 2021-11-18 LAB — IGP, APTIMA HPV: HPV Aptima: NEGATIVE

## 2021-12-26 ENCOUNTER — Encounter: Payer: Self-pay | Admitting: Physician Assistant

## 2021-12-26 ENCOUNTER — Ambulatory Visit: Payer: 59 | Admitting: Physician Assistant

## 2021-12-26 DIAGNOSIS — Z808 Family history of malignant neoplasm of other organs or systems: Secondary | ICD-10-CM | POA: Diagnosis not present

## 2021-12-26 DIAGNOSIS — Z1283 Encounter for screening for malignant neoplasm of skin: Secondary | ICD-10-CM | POA: Diagnosis not present

## 2021-12-26 NOTE — Progress Notes (Signed)
   Follow-Up Visit   Subjective  Alicia Ross is a 46 y.o. female who presents for the following: Annual Exam (New lesion on left wrist x years- gets red and itches. It has been there most of her life. No personal history of melanoma or non mole skin cancer. Family history of melanoma. (Mom)).   The following portions of the chart were reviewed this encounter and updated as appropriate:  Tobacco  Allergies  Meds  Problems  Med Hx  Surg Hx  Fam Hx      Objective  Well appearing patient in no apparent distress; mood and affect are within normal limits.  A full examination was performed including scalp, head, eyes, ears, nose, lips, neck, chest, axillae, abdomen, back, buttocks, bilateral upper extremities, bilateral lower extremities, hands, feet, fingers, toes, fingernails, and toenails. All findings within normal limits unless otherwise noted below.  No atypical nevi or signs of NMSC noted at the time of the visit.    Assessment & Plan  Screening exam for skin cancer  Yearly skin examinations    I, Eduar Kumpf, PA-C, have reviewed all documentation's for this visit.  The documentation on 12/26/21 for the exam, diagnosis, procedures and orders are all accurate and complete.

## 2022-01-09 ENCOUNTER — Other Ambulatory Visit: Payer: Self-pay | Admitting: Family Medicine

## 2022-01-09 DIAGNOSIS — Z1231 Encounter for screening mammogram for malignant neoplasm of breast: Secondary | ICD-10-CM

## 2022-01-15 ENCOUNTER — Ambulatory Visit
Admission: RE | Admit: 2022-01-15 | Discharge: 2022-01-15 | Disposition: A | Discharge status: Home / Self Care | Payer: 59 | Source: Ambulatory Visit | Attending: Family Medicine | Admitting: Family Medicine

## 2022-01-15 DIAGNOSIS — Z1231 Encounter for screening mammogram for malignant neoplasm of breast: Secondary | ICD-10-CM

## 2022-04-12 ENCOUNTER — Encounter: Payer: Self-pay | Admitting: Nurse Practitioner

## 2022-04-12 DIAGNOSIS — Z1239 Encounter for other screening for malignant neoplasm of breast: Secondary | ICD-10-CM

## 2022-04-23 ENCOUNTER — Telehealth: Payer: Self-pay | Admitting: Hematology and Oncology

## 2022-04-23 NOTE — Telephone Encounter (Signed)
Scheduled appt per 10/3 referral. Pt is aware of appt date and time. Pt is aware to arrive 15 mins prior to appt time and to bring and updated insurance card. Pt is aware of appt location.   

## 2022-05-16 ENCOUNTER — Inpatient Hospital Stay: Payer: 59 | Attending: Hematology and Oncology | Admitting: Hematology and Oncology

## 2022-05-16 ENCOUNTER — Encounter: Payer: Self-pay | Admitting: Hematology and Oncology

## 2022-05-16 ENCOUNTER — Inpatient Hospital Stay: Payer: 59

## 2022-05-16 VITALS — BP 131/84 | HR 65 | Temp 97.8°F | Resp 16 | Ht 65.0 in | Wt 211.9 lb

## 2022-05-16 DIAGNOSIS — Z803 Family history of malignant neoplasm of breast: Secondary | ICD-10-CM | POA: Diagnosis not present

## 2022-05-16 DIAGNOSIS — C50919 Malignant neoplasm of unspecified site of unspecified female breast: Secondary | ICD-10-CM | POA: Diagnosis not present

## 2022-05-16 NOTE — Progress Notes (Signed)
Alicia Ross CONSULT NOTE  Patient Care Team: Alicia Spikes, DO as PCP - General (Family Medicine) Alicia Danes, PA-C as Physician Assistant (Dermatology)  CHIEF COMPLAINTS/PURPOSE OF CONSULTATION:  Newly diagnosed breast cancer  Assessment and Plan  We discussed her life time risk of breast cancer and 5 yr risk of breast cancer today which is 37% per TC model and 1.3% per Gail's model respectively.  2. We discussed different risk reducing strategies for future breast cancer risk. We discussed chemoprevention and lifestyle modification. We discussed role of ongoing surveillance with mammograms versus MRIs.  At this time she will not be a candidate for tamoxifen for breast cancer prevention.  3. Lifestyle modification: We discussed different interventions exercise at least 5 days a week including both aerobic as well as weight-bearing exercises and strength training. We discussed dietary modification including increasing number of servings of fruit and vegetables as well as decreased in number of servings of meat. We discussed the importance of maintaining a good BMI and limiting alcohol intake.  4. Surveillance: Patient certainly would be a good candidate for ongoing mammograms on a yearly basis. Certainly she could benefit from a 3-D mammogram. We discussed self breast examination as well as ongoing clinical examination.  We discussed unknown long-term risk of gadolinium deposition in increased sensitivity of MRIs which may lead to additional biopsies.  5. Chemoprevention: As mentioned above  6. Genetics: Genetic testing negative  7. Followup: Patient will be seen back in 1 year for follow-up.  She was interested in MRI breast screening hence this has been ordered.  She will alternate with outside gynecology for breast exams and self breast exam once a month was recommended.  HISTORY OF PRESENTING ILLNESS:  This is a very pleasant 46 year old female patient with family  history of breast cancer, genetic testing negative, Tyrer-Cuzick model lifetime risk of breast cancer of about 37% referred to high risk breast clinic for additional recommendations.  Patient denies any biopsies in the past.  She was however recalled after an abnormal mammogram for additional imaging.  She otherwise denies any major medical issues.  She has 1 child who is 33 years old.  No history of hormone replacement therapy or birth control.   Rest of the pertinent 10 point ROS reviewed and negative  MEDICAL HISTORY:  Past Medical History:  Diagnosis Date   Family history of breast cancer    Family history of melanoma    Family history of pancreatic cancer    Family history of uterine cancer    GERD (gastroesophageal reflux disease)    Headache     SURGICAL HISTORY: Past Surgical History:  Procedure Laterality Date   CESAREAN SECTION  01/ 2008   INCISIONAL HERNIA REPAIR N/A 06/27/2020   Procedure: HERNIA REPAIR INCISIONAL;OPEN WITH MESH;  Surgeon: Alicia Cagey, MD;  Location: [facility];  Service: General;  Laterality: N/A;   KNEE ARTHROSCOPY Left 2006   LAPAROSCOPIC TUBAL LIGATION Bilateral 08/25/2014   Procedure: LAPAROSCOPIC TUBAL LIGATION with Filshie Clips;  Surgeon: Alicia Pearson, MD;  Location: [facility];  Service: Gynecology;  Laterality: Bilateral;   [place] EXCISION Left 1994  & 2004   in 2004  also removed spurs    SOCIAL HISTORY: Social History   Socioeconomic History   Marital status: Married    Spouse name: Not on file   Number of children: Not on file   Years of education: Not on  file   Highest education level: Not on file  Occupational History   Not on file  Tobacco Use   Smoking status: Never   Smokeless tobacco: Never  Vaping Use   Vaping Use: Never used  Substance and Sexual Activity   Alcohol use: No   Drug use: No   Sexual activity: Yes  Other Topics Concern   Not on file  Social  History Narrative   Not on file   Social Determinants of Health   Financial Resource Strain: Not on file  Food Insecurity: Not on file  Transportation Needs: Not on file  Physical Activity: Not on file  Stress: Not on file  Social Connections: Not on file  Intimate Partner Violence: Not on file    FAMILY HISTORY: Family History  Problem Relation Age of Onset   Melanoma Mother 52   Uterine cancer Mother 64   Hemochromatosis Father    Pancreatic cancer Maternal Uncle 85   Breast cancer Paternal Aunt 70   Arthritis Maternal Grandmother    Hemochromatosis Paternal Grandmother    Breast cancer Paternal Grandmother 81   Breast cancer Other 70   Breast cancer Other    Hemochromatosis Cousin        pat first cousin    ALLERGIES:  is allergic to codeine and colace [docusate].  MEDICATIONS:  Current Outpatient Medications  Medication Sig Dispense Refill   acetaminophen (TYLENOL) 500 MG tablet Take 500 mg by mouth as needed.     cholecalciferol (VITAMIN D3) 25 MCG (1000 UNIT) tablet Take 1,000 Units by mouth daily.     loratadine (CLARITIN) 10 MG tablet Take 10 mg by mouth daily.     Multiple Vitamins-Minerals (MULTIVITAMIN WOMEN PO) Take by mouth daily at 6 (six) AM.     OVER THE COUNTER MEDICATION daily at 6 (six) AM. [place] GLA complex- supplement- vit E and Linoleic acid     No current facility-administered medications for this visit.    REVIEW OF SYSTEMS:   Constitutional: Denies fevers, chills or abnormal night sweats Eyes: Denies blurriness of vision, double vision or watery eyes Ears, nose, mouth, throat, and face: Denies mucositis or sore throat Respiratory: Denies cough, dyspnea or wheezes Cardiovascular: Denies palpitation, chest discomfort or lower extremity swelling Gastrointestinal:  Denies nausea, heartburn or change in bowel habits Skin: Denies abnormal skin rashes Lymphatics: Denies new lymphadenopathy or easy bruising Neurological:Denies numbness,  tingling or new weaknesses Behavioral/Psych: Mood is stable, no new changes  Breast: Denies any palpable lumps or discharge All other systems were reviewed with the patient and are negative.  PHYSICAL EXAMINATION: ECOG PERFORMANCE STATUS: 0 - Asymptomatic  Vitals:   05/16/22 1005  BP: 131/84  Pulse: 65  Resp: 16  Temp: 97.8 F (36.6 C)  SpO2: 98%   Filed Weights   05/16/22 1005  Weight: 211 lb 14.4 oz (96.1 kg)    Physical Exam Constitutional:      Appearance: Normal appearance.  Cardiovascular:     Rate and Rhythm: Normal rate and regular rhythm.  Pulmonary:     Effort: Pulmonary effort is normal.     Breath sounds: Normal breath sounds.  Chest:     Comments: Bilateral breasts inspected.  No palpable masses.  No regional adenopathy Musculoskeletal:        General: No swelling.     Cervical back: Normal range of motion and neck supple. No rigidity.  Lymphadenopathy:     Cervical: No cervical adenopathy.  Skin:    General:  Skin is warm and dry.  Neurological:     Mental Status: She is alert.      LABORATORY DATA:  I have reviewed the data as listed Lab Results  Component Value Date   WBC 7.4 11/04/2021   HGB 15.2 11/04/2021   HCT 45.3 11/04/2021   MCV 94 11/04/2021   PLT 247 11/04/2021   Lab Results  Component Value Date   NA 140 11/04/2021   K 4.3 11/04/2021   CL 101 11/04/2021   CO2 24 11/04/2021    RADIOGRAPHIC STUDIES: I have personally reviewed the radiological reports and agreed with the findings in the report All questions were answered. The patient knows to call the clinic with any problems, questions or concerns.   I spent 45 minutes in the care of this patient including history, physical exam, review of records, counseling and coordination of care   Benay Pike, MD 05/16/22

## 2022-07-15 ENCOUNTER — Ambulatory Visit: Payer: 59 | Admitting: Family Medicine

## 2022-07-15 VITALS — BP 128/75 | HR 63 | Temp 97.8°F | Ht 65.0 in | Wt 207.0 lb

## 2022-07-15 DIAGNOSIS — J019 Acute sinusitis, unspecified: Secondary | ICD-10-CM | POA: Diagnosis not present

## 2022-07-15 DIAGNOSIS — J989 Respiratory disorder, unspecified: Secondary | ICD-10-CM | POA: Diagnosis not present

## 2022-07-15 MED ORDER — AMOXICILLIN 500 MG PO CAPS: 500.0000 mg | ORAL_CAPSULE | Freq: Three times a day (TID) | ORAL | 0 refills | Status: AC

## 2022-07-15 NOTE — Progress Notes (Signed)
   Subjective:    Patient ID: Alicia Ross, female    DOB: 1976-06-12, 47 y.o.   MRN: [id number]  HPI  Cough yellow / green mucous since last week- today cough is more non- productive, some wheezing over the weekend intermittent with coughing fits, no fever, sinus drainage Sinus pressure , headache currently Started approximately 2 weeks ago then got worse over several days then gradually got better then over the past couple days got worse Review of Systems     Objective:   Physical Exam  General-in no acute distress Eyes-no discharge Lungs-respiratory rate normal, CTA CV-no murmurs,RRR Extremities skin warm dry no edema Neuro grossly normal Behavior normal, alert  I do not feel the patient needs x-rays lab work or viral swabs warning signs were discussed     Assessment & Plan:  Viral syndrome for the past 2 weeks Now with secondary illness Probable sinus infection with bronchial involvement-recommend antibiotics amoxicillin 3 times daily for 7 days warning signs discussed follow-up if problems

## 2022-08-15 ENCOUNTER — Ambulatory Visit (HOSPITAL_COMMUNITY)
Admission: RE | Admit: 2022-08-15 | Discharge: 2022-08-15 | Disposition: A | Discharge status: Home / Self Care | Payer: 59 | Source: Ambulatory Visit | Attending: Hematology and Oncology | Admitting: Hematology and Oncology

## 2022-08-15 DIAGNOSIS — Z1231 Encounter for screening mammogram for malignant neoplasm of breast: Secondary | ICD-10-CM | POA: Diagnosis not present

## 2022-08-15 DIAGNOSIS — Z803 Family history of malignant neoplasm of breast: Secondary | ICD-10-CM | POA: Diagnosis present

## 2022-08-15 MED ORDER — GADOBUTROL 1 MMOL/ML IV SOLN
9.0000 mL | Freq: Once | INTRAVENOUS | Status: AC | PRN
  Administered 2022-08-15: 9 mL via INTRAVENOUS

## 2022-10-03 ENCOUNTER — Telehealth: Payer: Self-pay | Admitting: Family Medicine

## 2022-10-03 NOTE — Telephone Encounter (Signed)
Patient has physical 4/26 and needing labs done she her iron and ferritin checked.

## 2022-10-04 ENCOUNTER — Other Ambulatory Visit: Payer: Self-pay | Admitting: Nurse Practitioner

## 2022-10-04 DIAGNOSIS — R5383 Other fatigue: Secondary | ICD-10-CM

## 2022-10-04 DIAGNOSIS — E559 Vitamin D deficiency, unspecified: Secondary | ICD-10-CM

## 2022-10-04 DIAGNOSIS — Z1329 Encounter for screening for other suspected endocrine disorder: Secondary | ICD-10-CM

## 2022-10-04 DIAGNOSIS — N920 Excessive and frequent menstruation with regular cycle: Secondary | ICD-10-CM

## 2022-10-04 DIAGNOSIS — Z13220 Encounter for screening for lipoid disorders: Secondary | ICD-10-CM

## 2022-10-29 LAB — COMPREHENSIVE METABOLIC PANEL
ALT: 12 IU/L (ref 0–32)
AST: 11 IU/L (ref 0–40)
Albumin/Globulin Ratio: 2 (ref 1.2–2.2)
Albumin: 4.9 g/dL (ref 3.9–4.9)
Alkaline Phosphatase: 66 IU/L (ref 44–121)
BUN/Creatinine Ratio: 23 (ref 9–23)
BUN: 14 mg/dL (ref 6–24)
Bilirubin Total: 0.6 mg/dL (ref 0.0–1.2)
CO2: 22 mmol/L (ref 20–29)
Calcium: 9.7 mg/dL (ref 8.7–10.2)
Chloride: 102 mmol/L (ref 96–106)
Creatinine, Ser: 0.61 mg/dL (ref 0.57–1.00)
Globulin, Total: 2.4 g/dL (ref 1.5–4.5)
Glucose: 92 mg/dL (ref 70–99)
Potassium: 4.7 mmol/L (ref 3.5–5.2)
Sodium: 140 mmol/L (ref 134–144)
Total Protein: 7.3 g/dL (ref 6.0–8.5)
eGFR: 111 mL/min/{1.73_m2} (ref >59)

## 2022-10-29 LAB — CBC WITH DIFFERENTIAL/PLATELET
Basophils Absolute: 0 10*3/uL (ref 0.0–0.2)
Basos: 0 %
EOS (ABSOLUTE): 0.1 10*3/uL (ref 0.0–0.4)
Eos: 1 %
Hematocrit: 44.3 % (ref 34.0–46.6)
Hemoglobin: 14.8 g/dL (ref 11.1–15.9)
Immature Grans (Abs): 0 10*3/uL (ref 0.0–0.1)
Immature Granulocytes: 0 %
Lymphocytes Absolute: 2.6 10*3/uL (ref 0.7–3.1)
Lymphs: 39 %
MCH: 32 pg (ref 26.6–33.0)
MCHC: 33.4 g/dL (ref 31.5–35.7)
MCV: 96 fL (ref 79–97)
Monocytes Absolute: 0.6 10*3/uL (ref 0.1–0.9)
Monocytes: 9 %
Neutrophils Absolute: 3.3 10*3/uL (ref 1.4–7.0)
Neutrophils: 51 %
Platelets: 188 10*3/uL (ref 150–450)
RBC: 4.63 x10E6/uL (ref 3.77–5.28)
RDW: 11.8 % (ref 11.7–15.4)
WBC: 6.5 10*3/uL (ref 3.4–10.8)

## 2022-10-29 LAB — LIPID PANEL
Chol/HDL Ratio: 3.6 ratio (ref 0.0–4.4)
Cholesterol, Total: 167 mg/dL (ref 100–199)
HDL: 47 mg/dL (ref >39)
LDL Chol Calc (NIH): 101 mg/dL — ABNORMAL HIGH (ref 0–99)
Triglycerides: 104 mg/dL (ref 0–149)
VLDL Cholesterol Cal: 19 mg/dL (ref 5–40)

## 2022-10-29 LAB — IRON,TIBC AND FERRITIN PANEL
Ferritin: 74 ng/mL (ref 15–150)
Iron Saturation: 46 % (ref 15–55)
Iron: 144 ug/dL (ref 27–159)
Total Iron Binding Capacity: 311 ug/dL (ref 250–450)
UIBC: 167 ug/dL (ref 131–425)

## 2022-10-29 LAB — TSH: TSH: 2.25 u[IU]/mL (ref 0.450–4.500)

## 2022-10-29 LAB — VITAMIN D 25 HYDROXY (VIT D DEFICIENCY, FRACTURES): Vit D, 25-Hydroxy: 57.1 ng/mL (ref 30.0–100.0)

## 2022-10-30 NOTE — Telephone Encounter (Signed)
Patient has already gotten labs.

## 2022-11-07 ENCOUNTER — Ambulatory Visit (INDEPENDENT_AMBULATORY_CARE_PROVIDER_SITE_OTHER): Payer: 59 | Admitting: Nurse Practitioner

## 2022-11-07 VITALS — BP 132/84 | Ht 65.0 in | Wt 201.8 lb

## 2022-11-07 DIAGNOSIS — Z01411 Encounter for gynecological examination (general) (routine) with abnormal findings: Secondary | ICD-10-CM | POA: Diagnosis not present

## 2022-11-07 DIAGNOSIS — E669 Obesity, unspecified: Secondary | ICD-10-CM | POA: Diagnosis not present

## 2022-11-07 DIAGNOSIS — Z1211 Encounter for screening for malignant neoplasm of colon: Secondary | ICD-10-CM

## 2022-11-07 DIAGNOSIS — N951 Menopausal and female climacteric states: Secondary | ICD-10-CM

## 2022-11-07 DIAGNOSIS — E66811 Obesity, class 1: Secondary | ICD-10-CM

## 2022-11-07 DIAGNOSIS — Z01419 Encounter for gynecological examination (general) (routine) without abnormal findings: Secondary | ICD-10-CM

## 2022-11-07 NOTE — Progress Notes (Unsigned)
Subjective:    Patient ID: Alicia Ross, female    DOB: [birth year], 47 y.o.   MRN: [id number]  HPI  The patient comes in today for a wellness visit. Patient does have concerns with her emotions being labile around the time of her menstrual cycle. She does have regular menstrual cycles lasting 6-7 days with 4 of those days being heavy. She does have a family history of hemochromatosis. Has had a tubal ligation for birth control. Sexually active with one female partner in the last year. She is married. Her mammogram is up to date, and [facility] due to a high risk of breast cancer. She is also due for colonoscopy. She had polyps when she was younger. Gets regular eye and dental exams. Patient deferred Tdap vaccine at this time.   A review of their health history was completed.  A review of medications was also completed.  Any needed refills; no  Eating habits: health diet with a variety of fruits and vegetables.   Falls/  MVA accidents in past few months: no  Regular exercise: walking, home exercise  Specialist pt sees on regular basis: no  Preventative health issues were discussed.   Additional concerns: sore spot on breast                                    Anxiety and hormones Review of Systems  Constitutional:  Negative for fatigue and fever.  HENT:  Negative for sore throat and trouble swallowing.   Respiratory:  Negative for cough, chest tightness, shortness of breath and wheezing.   Cardiovascular:  Negative for chest pain and palpitations.  Gastrointestinal:  Negative for constipation, diarrhea, nausea and vomiting.  Genitourinary:  Negative for difficulty urinating, dysuria, enuresis, frequency, genital sores, menstrual problem, pelvic pain, urgency and vaginal discharge.  Psychiatric/Behavioral:  Negative for sleep disturbance.       Objective:   Physical Exam Vitals and nursing note reviewed. Exam conducted with a chaperone present.   Constitutional:      General: She is not in acute distress. Cardiovascular:     Rate and Rhythm: Normal rate and regular rhythm.     Pulses: Normal pulses.     Heart sounds: S1 normal and S2 normal. No murmur heard. Pulmonary:     Effort: Pulmonary effort is normal. No respiratory distress.     Breath sounds: No wheezing.  Chest:  Breasts:    Right: No inverted nipple, mass or skin change.     Left: No inverted nipple, mass or skin change.     Comments: Fine nodularity noted throughout bilateral breasts.  Abdominal:     General: There is no distension.     Palpations: Abdomen is soft. There is no mass.     Tenderness: There is no abdominal tenderness.  Genitourinary:    Comments: Defers pelvic exam. Denies any problems.  Lymphadenopathy:     Cervical: No cervical adenopathy.     Upper Body:     Right upper body: No supraclavicular, axillary or pectoral adenopathy.     Left upper body: No supraclavicular, axillary or pectoral adenopathy.  Skin:    General: Skin is warm and dry.  Neurological:     Mental Status: She is alert.  Psychiatric:        Mood and Affect: Mood normal.        Behavior: Behavior normal.  Thought Content: Thought content normal.        Judgment: Judgment normal.    Vitals:   11/07/22 1354  BP: 132/84  Height: 5\' 5"  (1.651 m)  Weight: 201 lb 12.8 oz (91.5 kg)  BMI (Calculated): 33.58       11/07/2022    2:18 PM 11/08/2021    1:12 PM 03/15/2021    1:27 PM 11/09/2020    1:20 PM 10/12/2020    4:59 PM  Depression screen PHQ 2/9  Decreased Interest 0 0 0 0 0  Down, Depressed, Hopeless 1 0 0 0 1  PHQ - 2 Score 1 0 0 0 1  Altered sleeping 1   0 2  Tired, decreased energy 0   0 1  Change in appetite 0   0 0  Feeling bad or failure about yourself  1   0 1  Trouble concentrating 0   0 0  Moving slowly or fidgety/restless 1   0 0  Suicidal thoughts 0   0 0  PHQ-9 Score 4   0 5  Difficult doing work/chores Somewhat difficult   Not difficult at all  Not difficult at all       11/07/2022    2:19 PM 10/12/2020    5:05 PM 10/12/2020    4:54 PM  GAD 7 : Generalized Anxiety Score  Nervous, Anxious, on Edge 1 2 0  Control/stop worrying 1 1 0  Worry too much - different things 0 2 0  Trouble relaxing 1 1 1   Restless 1 1 1   Easily annoyed or irritable 0 1 0  Afraid - awful might happen 0 0 0  Total GAD 7 Score 4 8 2   Anxiety Difficulty Somewhat difficult Somewhat difficult Not difficult at all      Assessment & Plan:  1. Well woman exam Continue healthy diet, exercise and weight loss efforts. Continue follow up with high risk breast cancer clinic.  2. Screening for colon cancer - Ambulatory referral to Gastroenterology  3. Obesity (BMI 30.0-34.9) Continue weight loss efforts.  4. Perimenopause Discussed natural alternatives to help with menopausal symptoms. Defers medication or hormones.     Return in about 1 year (around 11/07/2023) for physical.

## 2022-11-07 NOTE — Progress Notes (Unsigned)
   Subjective:    Patient ID: Alicia Ross, female    DOB: [birth year], 47 y.o.   MRN: [id number]  HPI  The patient comes in today for a wellness visit.    A review of their health history was completed.  A review of medications was also completed.  Any needed refills; no  Eating habits: good  Falls/  MVA accidents in past few months: no  Regular exercise: walking  Specialist pt sees on regular basis: no  Preventative health issues were discussed.   Review of Systems     Objective:   Physical Exam        Assessment & Plan:

## 2022-11-07 NOTE — Patient Instructions (Signed)
Soy, flaxseed

## 2022-11-08 ENCOUNTER — Encounter: Payer: Self-pay | Admitting: Nurse Practitioner

## 2022-11-08 DIAGNOSIS — E66811 Obesity, class 1: Secondary | ICD-10-CM | POA: Insufficient documentation

## 2022-11-08 DIAGNOSIS — Z1211 Encounter for screening for malignant neoplasm of colon: Secondary | ICD-10-CM | POA: Insufficient documentation

## 2022-11-08 DIAGNOSIS — E669 Obesity, unspecified: Secondary | ICD-10-CM | POA: Insufficient documentation

## 2022-11-11 ENCOUNTER — Encounter: Payer: Self-pay | Admitting: *Deleted

## 2023-02-19 ENCOUNTER — Ambulatory Visit (INDEPENDENT_AMBULATORY_CARE_PROVIDER_SITE_OTHER): Payer: 59 | Admitting: Family Medicine

## 2023-02-19 VITALS — BP 112/78 | HR 99 | Temp 98.4°F | Ht 65.0 in | Wt 202.4 lb

## 2023-02-19 DIAGNOSIS — J988 Other specified respiratory disorders: Secondary | ICD-10-CM

## 2023-02-19 DIAGNOSIS — J029 Acute pharyngitis, unspecified: Secondary | ICD-10-CM | POA: Diagnosis not present

## 2023-02-19 MED ORDER — AMOXICILLIN 500 MG PO CAPS: 500.0000 mg | ORAL_CAPSULE | Freq: Two times a day (BID) | ORAL | 0 refills | Status: AC

## 2023-02-19 NOTE — Patient Instructions (Signed)
Antibiotic as prescribed.  Rest.   Lots of fluids.

## 2023-02-20 ENCOUNTER — Telehealth: Payer: Self-pay | Admitting: Family Medicine

## 2023-02-20 NOTE — Telephone Encounter (Signed)
Patient had FMLA faxed over after her visit on 02/19/2023. In your box to complete.

## 2023-02-21 DIAGNOSIS — J988 Other specified respiratory disorders: Secondary | ICD-10-CM | POA: Insufficient documentation

## 2023-02-21 NOTE — Assessment & Plan Note (Signed)
Rapid strep negative. Treating empirically with Augmentin.

## 2023-02-21 NOTE — Progress Notes (Signed)
Subjective:  Patient ID: Alicia Ross, female    DOB: 02/25/1976  Age: 47 y.o. MRN: [id number]  CC: Chief Complaint  Patient presents with   Cough    Pt come sin today with a cough, headache, sore throat, drainage, and ear pain since Friday 02/13/23. Pt no longer has a fever but did Saturday 08/03-08/05    HPI:  47 year old female presents with respiratory symptoms.   Symptoms started Friday. She reports headache, cough, congestion, ear pain, sore throat, drainage. Had fever Saturday.  No relieving factors. No known exacerbating factors. No current fever. No other complaints.    Patient Active Problem List   Diagnosis Date Noted   Sore throat 02/23/2023   Obesity (BMI 30.0-34.9) 11/08/2022   Screening for colon cancer 11/08/2022   Genetic testing 05/16/2021   Family history of breast cancer 05/01/2021   Family history of pancreatic cancer 05/01/2021   Family history of melanoma 05/01/2021   Family history of uterine cancer 05/01/2021   Breast cancer screening, high risk patient 03/17/2021   Dense breasts 03/17/2021   Perimenopause 03/17/2021   Abnormal uterine bleeding 03/17/2021   Sleep disturbance 11/10/2020   Incisional hernia, without obstruction or gangrene 05/31/2020   Vitamin D deficiency 03/02/2020   Menorrhagia with regular cycle 03/02/2020    Social Hx   Social History   Socioeconomic History   Marital status: Married    Spouse name: Not on file   Number of children: Not on file   Years of education: Not on file   Highest education level: Not on file  Occupational History   Not on file  Tobacco Use   Smoking status: Never   Smokeless tobacco: Never  Vaping Use   Vaping status: Never Used  Substance and Sexual Activity   Alcohol use: No   Drug use: No   Sexual activity: Yes  Other Topics Concern   Not on file  Social History Narrative   Not on file   Social Determinants of Health   Financial Resource Strain: Not on file  Food Insecurity:  Not on file  Transportation Needs: Not on file  Physical Activity: Not on file  Stress: Not on file  Social Connections: Not on file    Review of Systems Per HPI  Objective:  BP 112/78   Pulse 99   Temp 98.4 F (36.9 C)   Ht 5\' 5"  (1.651 m)   Wt 202 lb 6.4 oz (91.8 kg)   SpO2 97%   BMI 33.68 kg/m      02/19/2023    1:26 PM 11/07/2022    1:54 PM 07/15/2022   11:34 AM  BP/Weight  Systolic BP 112 132 128  Diastolic BP 78 84 75  Wt. (Lbs) 202.4 201.8 207  BMI 33.68 kg/m2 33.58 kg/m2 34.45 kg/m2    Physical Exam Vitals and nursing note reviewed.  Constitutional:      Appearance: Normal appearance.  HENT:     Head: Normocephalic and atraumatic.     Right Ear: Tympanic membrane normal.     Left Ear: Tympanic membrane normal.     Mouth/Throat:     Pharynx: Posterior oropharyngeal erythema present.  Eyes:     Conjunctiva/sclera: Conjunctivae normal.  Cardiovascular:     Rate and Rhythm: Normal rate and regular rhythm.  Pulmonary:     Effort: Pulmonary effort is normal.     Breath sounds: Normal breath sounds. No wheezing or rales.  Neurological:     Mental Status:  She is alert.     Lab Results  Component Value Date   WBC 6.5 10/28/2022   HGB 14.8 10/28/2022   HCT 44.3 10/28/2022   PLT 188 10/28/2022   GLUCOSE 92 10/28/2022   CHOL 167 10/28/2022   TRIG 104 10/28/2022   HDL 47 10/28/2022   LDLCALC 101 (H) 10/28/2022   ALT 12 10/28/2022   AST 11 10/28/2022   NA 140 10/28/2022   K 4.7 10/28/2022   CL 102 10/28/2022   CREATININE 0.61 10/28/2022   BUN 14 10/28/2022   CO2 22 10/28/2022   TSH 2.250 10/28/2022     Assessment & Plan:   Problem List Items Addressed This Visit       Other   Sore throat - Primary    Rapid strep positive. Treating with Amoxicillin.        Meds ordered this encounter  Medications   amoxicillin (AMOXIL) 500 MG capsule    Sig: Take 1 capsule (500 mg total) by mouth 2 (two) times daily for 10 days.    Dispense:  20  capsule    Refill:  0    Follow-up:  Return if symptoms worsen or fail to improve.  Everlene Other DO [facility]

## 2023-02-23 ENCOUNTER — Encounter: Payer: Self-pay | Admitting: Family Medicine

## 2023-02-23 DIAGNOSIS — Z0279 Encounter for issue of other medical certificate: Secondary | ICD-10-CM

## 2023-02-23 DIAGNOSIS — J029 Acute pharyngitis, unspecified: Secondary | ICD-10-CM | POA: Insufficient documentation

## 2023-02-23 NOTE — Assessment & Plan Note (Signed)
Rapid strep positive. Treating with Amoxicillin.

## 2023-02-24 ENCOUNTER — Encounter: Payer: Self-pay | Admitting: Family Medicine

## 2023-02-24 ENCOUNTER — Telehealth: Payer: Self-pay

## 2023-02-24 NOTE — Telephone Encounter (Signed)
Patient called and states she started taking antibiotic last Thursday. Patient states she still has headache, sore throat is better, no fever, really fatigue and congested, ears feel full. Consulted with Dr Adriana Simas finish antibiotic and drink plenty of fluids and rest. Patient requested work note to be out the rest of week and go back to work Monday. Patient verbalized understanding. Work note at the front desk ready for pick up. Patient aware.

## 2023-03-03 NOTE — Telephone Encounter (Signed)
Everlene Other G, DO     Doubt strep. If she wants to be seen, I'm okay with that.

## 2023-04-02 ENCOUNTER — Emergency Department (HOSPITAL_COMMUNITY): Payer: 59

## 2023-04-02 ENCOUNTER — Emergency Department (HOSPITAL_COMMUNITY)
Admission: EM | Admit: 2023-04-02 | Discharge: 2023-04-02 | Disposition: A | Discharge status: Home / Self Care | Payer: 59 | Attending: Emergency Medicine | Admitting: Emergency Medicine

## 2023-04-02 DIAGNOSIS — K6289 Other specified diseases of anus and rectum: Secondary | ICD-10-CM | POA: Diagnosis not present

## 2023-04-02 DIAGNOSIS — K644 Residual hemorrhoidal skin tags: Secondary | ICD-10-CM | POA: Diagnosis not present

## 2023-04-02 DIAGNOSIS — R1030 Lower abdominal pain, unspecified: Secondary | ICD-10-CM | POA: Insufficient documentation

## 2023-04-02 DIAGNOSIS — D72829 Elevated white blood cell count, unspecified: Secondary | ICD-10-CM | POA: Diagnosis not present

## 2023-04-02 LAB — COMPREHENSIVE METABOLIC PANEL
ALT: 19 U/L (ref 0–44)
AST: 16 U/L (ref 15–41)
Albumin: 4.3 g/dL (ref 3.5–5.0)
Alkaline Phosphatase: 54 U/L (ref 38–126)
Anion gap: 11 (ref 5–15)
BUN: 18 mg/dL (ref 6–20)
CO2: 21 mmol/L — ABNORMAL LOW (ref 22–32)
Calcium: 8.9 mg/dL (ref 8.9–10.3)
Chloride: 105 mmol/L (ref 98–111)
Creatinine, Ser: 0.51 mg/dL (ref 0.44–1.00)
GFR, Estimated: >60 mL/min (ref >60)
Glucose, Bld: 104 mg/dL — ABNORMAL HIGH (ref 70–99)
Potassium: 3.8 mmol/L (ref 3.5–5.1)
Sodium: 137 mmol/L (ref 135–145)
Total Bilirubin: 1.2 mg/dL (ref 0.3–1.2)
Total Protein: 7.4 g/dL (ref 6.5–8.1)

## 2023-04-02 LAB — CBC
HCT: 40.7 % (ref 36.0–46.0)
Hemoglobin: 13.9 g/dL (ref 12.0–15.0)
MCH: 32 pg (ref 26.0–34.0)
MCHC: 34.2 g/dL (ref 30.0–36.0)
MCV: 93.8 fL (ref 80.0–100.0)
Platelets: 203 10*3/uL (ref 150–400)
RBC: 4.34 MIL/uL (ref 3.87–5.11)
RDW: 12.2 % (ref 11.5–15.5)
WBC: 12.7 10*3/uL — ABNORMAL HIGH (ref 4.0–10.5)
nRBC: 0 % (ref 0.0–0.2)

## 2023-04-02 LAB — URINALYSIS, ROUTINE W REFLEX MICROSCOPIC
Bilirubin Urine: NEGATIVE
Glucose, UA: NEGATIVE mg/dL
Ketones, ur: NEGATIVE mg/dL
Leukocytes,Ua: NEGATIVE
Nitrite: NEGATIVE
Protein, ur: NEGATIVE mg/dL
Specific Gravity, Urine: 1.023 (ref 1.005–1.030)
pH: 5 (ref 5.0–8.0)

## 2023-04-02 LAB — HCG, QUANTITATIVE, PREGNANCY: hCG, Beta Chain, Quant, S: 4 m[IU]/mL (ref <5)

## 2023-04-02 LAB — POC OCCULT BLOOD, ED: Fecal Occult Bld: POSITIVE — AB

## 2023-04-02 MED ORDER — ONDANSETRON HCL 4 MG/2ML IJ SOLN
4.0000 mg | Freq: Once | INTRAMUSCULAR | Status: AC
  Administered 2023-04-02: 4 mg via INTRAVENOUS
  Filled 2023-04-02: qty 2

## 2023-04-02 MED ORDER — IOHEXOL 300 MG/ML  SOLN
100.0000 mL | Freq: Once | INTRAMUSCULAR | Status: AC | PRN
  Administered 2023-04-02: 100 mL via INTRAVENOUS

## 2023-04-02 MED ORDER — ONDANSETRON 4 MG PO TBDP: 4.0000 mg | ORAL_TABLET | Freq: Four times a day (QID) | ORAL | 0 refills | Status: DC | PRN

## 2023-04-02 MED ORDER — KETOROLAC TROMETHAMINE 30 MG/ML IJ SOLN
15.0000 mg | Freq: Once | INTRAMUSCULAR | Status: AC
  Administered 2023-04-02: 15 mg via INTRAVENOUS
  Filled 2023-04-02: qty 1

## 2023-04-02 MED ORDER — HYDROCODONE-ACETAMINOPHEN 5-325 MG PO TABS
1.0000 | ORAL_TABLET | Freq: Once | ORAL | Status: AC
  Administered 2023-04-02: 1 via ORAL
  Filled 2023-04-02: qty 1

## 2023-04-02 MED ORDER — MORPHINE SULFATE (PF) 4 MG/ML IV SOLN
4.0000 mg | Freq: Once | INTRAVENOUS | Status: AC
  Administered 2023-04-02: 4 mg via INTRAVENOUS
  Filled 2023-04-02: qty 1

## 2023-04-02 MED ORDER — HYDROCODONE-ACETAMINOPHEN 5-325 MG PO TABS
1.0000 | ORAL_TABLET | Freq: Four times a day (QID) | ORAL | 0 refills | Status: DC | PRN
Start: 2023-04-02 — End: 2023-06-30

## 2023-04-02 MED ORDER — SODIUM CHLORIDE 0.9 % IV BOLUS
1000.0000 mL | Freq: Once | INTRAVENOUS | Status: AC
  Administered 2023-04-02: 1000 mL via INTRAVENOUS

## 2023-04-02 NOTE — Discharge Instructions (Addendum)
Your CT scan showed the uterine fibroid and it also showed a small amount of inflammation around the rectum.  Follow-up with your gynecologist tomorrow as planned in addition to that please call to schedule follow-up appointment with the GI doctor for further evaluation of the inflammation noted around the rectum.  In the meantime you can continue using the prescribed Naprosyn (anti-inflammatory) twice daily to help with pain.  You can use prescribed hydrocodone as needed for severe breakthrough pain 1 tablet every 6 hours, this medication can cause drowsiness and you should not drive while taking this medication.  You can use Zofran as needed for nausea and vomiting and I encourage you to take this with the hydrocodone to prevent nausea.  To help with burning and rectal pain you can purchase over-the-counter RectiCare lidocaine cream and apply this to the anus and rectum. Discussed with your gynecologist whether or not follow-up with urology would also be helpful given your urinary discomfort, your labs today did not show any signs of urinary tract infection.  If you develop fevers, new or worsening pain, increased rectal bleeding or blood in stool or any other new or concerning symptoms please return to the ED for reevaluation.

## 2023-04-02 NOTE — ED Triage Notes (Signed)
Patient here from home reporting pelvic pain for weeks. Seen by OB diagnosed with fibroids - requesting CT scan with contrast.

## 2023-04-02 NOTE — ED Provider Notes (Signed)
[place] EMERGENCY DEPARTMENT AT [facility] Provider Note   CSN: [id number] Arrival date & time: 04/02/23  0749     History  Chief Complaint  Patient presents with   Pelvic Pain    Alicia Ross is a 47 y.o. female.  Alicia Ross is a 47 y.o. female with a history of GERD, headaches and uterine fibroid, who presents to the emergency department for evaluation of lower abdominal and pelvic pain.  Patient reports she has been experiencing the pain for 2 weeks, it seems to move throughout her lower abdomen, pelvis and low back.  She saw her gynecologist for this yesterday and had an ultrasound done that showed a fibroid that had increased in size, but given severity of pain it was recommended that she get a CT scan to further evaluate her symptoms.  She states that she has had discomfort with urination frequently but has had UAs have been negative for infection.  She reports last night she was also having some sharp pains when trying to have a bowel movement does not and noted some blood in her stool.  She does report a history of hemorrhoids.  No melena.  Reports a prior tubal ligation but no other abdominal surgeries.  Reports she is due for colonoscopy but has not had this yet when these labs, colonoscopy done several years ago in [place] showed some benign polyps.  The history is provided by the patient, the spouse and medical records.  Pelvic Pain Associated symptoms include abdominal pain. Pertinent negatives include no chest pain and no shortness of breath.       Home Medications Prior to Admission medications   Medication Sig Start Date End Date Taking? Authorizing Provider  acetaminophen (TYLENOL) 500 MG tablet Take 500 mg by mouth as needed.    [provider]  cholecalciferol (VITAMIN D3) 25 MCG (1000 UNIT) tablet Take 1,000 Units by mouth daily.    [provider]  loratadine (CLARITIN) 10 MG tablet Take 10 mg by mouth daily.     [provider]  Multiple Vitamins-Minerals (MULTIVITAMIN WOMEN PO) Take by mouth daily at 6 (six) AM.    [provider]  OVER THE COUNTER MEDICATION daily at 6 (six) AM. [place] GLA complex- supplement- vit E and Linoleic acid    [provider]      Allergies    Codeine and Colace [docusate]    Review of Systems   Review of Systems  Constitutional:  Negative for chills and fever.  HENT: Negative.    Respiratory:  Negative for cough and shortness of breath.   Cardiovascular:  Negative for chest pain.  Gastrointestinal:  Positive for abdominal pain. Negative for nausea and vomiting.  Genitourinary:  Positive for pelvic pain. Negative for vaginal bleeding and vaginal discharge.    Physical Exam Updated Vital Signs BP (!) 142/99 (BP Location: Left Arm)   Pulse 70   Temp 99 F (37.2 C) (Oral)   Resp 18   SpO2 100%  Physical Exam Vitals and nursing note reviewed.  Constitutional:      General: She is not in acute distress.    Appearance: Normal appearance. She is well-developed. She is not ill-appearing or diaphoretic.  HENT:     Head: Normocephalic and atraumatic.  Eyes:     General:        Right eye: No discharge.        Left eye: No discharge.     Pupils: Pupils are  equal, round, and reactive to light.  Cardiovascular:     Rate and Rhythm: Normal rate and regular rhythm.     Pulses: Normal pulses.     Heart sounds: Normal heart sounds.  Pulmonary:     Effort: Pulmonary effort is normal. No respiratory distress.     Breath sounds: Normal breath sounds. No wheezing or rales.     Comments: Respirations equal and unlabored, patient able to speak in full sentences, lungs clear to auscultation bilaterally  Abdominal:     General: Bowel sounds are normal. There is no distension.     Palpations: Abdomen is soft. There is no mass.     Tenderness: There is abdominal tenderness. There is no guarding.     Comments: Abdomen soft, nondistended, bowel  sounds present throughout, there is diffuse tenderness across the lower abdomen that does not localize to 1 side.  Genitourinary:    Comments: Pelvic exam completed at gynecology office yesterday so repeat examination was deferred. Chaperone present during rectal exam, few small nonthrombosed external hemorrhoids noted, no fissures noted.  Patient with diffuse tenderness with internal palpation on digital rectal exam.  Will minimal soft brown stool noted in the rectal vault, no gross blood. Musculoskeletal:        General: No deformity.     Cervical back: Neck supple.  Skin:    General: Skin is warm and dry.     Capillary Refill: Capillary refill takes less than 2 seconds.  Neurological:     Mental Status: She is alert and oriented to person, place, and time.     Coordination: Coordination normal.     Comments: Speech is clear, able to follow commands CN III-XII intact Normal strength in upper and lower extremities bilaterally including dorsiflexion and plantar flexion, strong and equal grip strength Sensation normal to light and sharp touch Moves extremities without ataxia, coordination intact  Psychiatric:        Mood and Affect: Mood normal.        Behavior: Behavior normal.     ED Results / Procedures / Treatments   Labs (all labs ordered are listed, but only abnormal results are displayed) Labs Reviewed  COMPREHENSIVE METABOLIC PANEL - Abnormal; Notable for the following components:      Result Value   CO2 21 (*)    Glucose, Bld 104 (*)    All other components within normal limits  CBC - Abnormal; Notable for the following components:   WBC 12.7 (*)    All other components within normal limits  URINALYSIS, ROUTINE W REFLEX MICROSCOPIC - Abnormal; Notable for the following components:   Hgb urine dipstick SMALL (*)    Bacteria, UA RARE (*)    All other components within normal limits  HCG, QUANTITATIVE, PREGNANCY    EKG None  Radiology CT ABDOMEN PELVIS W  CONTRAST  Result Date: 04/02/2023 CLINICAL DATA:  Pelvic pain for weeks. EXAM: CT ABDOMEN AND PELVIS WITH CONTRAST TECHNIQUE: Multidetector CT imaging of the abdomen and pelvis was performed using the standard protocol following bolus administration of intravenous contrast. RADIATION DOSE REDUCTION: This exam was performed according to the departmental dose-optimization program which includes automated exposure control, adjustment of the mA and/or kV according to patient size and/or use of iterative reconstruction technique. CONTRAST:  OMNIPAQUE IOHEXOL 300 MG/ML  SOLN COMPARISON:  None Available. FINDINGS: Lower chest: Lung bases are clear.  No pleural effusion. Hepatobiliary: Mild fatty liver infiltration. No space-occupying liver lesion. Patent portal vein. Gallbladder is  present. Pancreas: Unremarkable. No pancreatic ductal dilatation or surrounding inflammatory changes. Spleen: Normal in size without focal abnormality. Adrenals/Urinary Tract: The adrenal glands are preserved. Slightly lobular contours of the kidneys, nonspecific. No enhancing mass or collecting system dilatation. There is some high density material along the renal collecting systems, nonspecific. Please correlate with any prior contrast administration. The ureters have normal course and caliber extending down to the urinary bladder. Preserved contours of the urinary bladder which is underdistended. Stomach/Bowel: On this non oral contrast exam, the stomach and small bowel are nondilated. Moderate right-sided colonic stool. Large bowel is of normal course and caliber. Normal appendix seen in the right lower quadrant. There is slight stranding in the fat next of the low rectum of uncertain etiology and significance. Please correlate with particular symptoms and clinical evaluation of the anorectal region. Vascular/Lymphatic: No significant vascular findings are present. No enlarged abdominal or pelvic lymph nodes. Reproductive: Uterus  measures 10.4 x 7.2 x 6.8 cm. By CT the endometrial stripe is estimated at 14 mm. Heterogeneous myometrium with some lobular areas. This includes a mass or fibroid posteriorly along the midbody measuring up to 3.3 cm. Preserved ovaries with some small follicles or cysts. Other: Trace free fluid in the pelvis.  No free air. Musculoskeletal: Moderate degenerative changes of the spine and pelvis. There are bridging osteophytes along the sacroiliac joints. IMPRESSION: No bowel obstruction, free air or free fluid.  Normal appendix. There is some subtle fat stranding exit decompressed anorectal region with some heterogeneity. This is nonspecific. Please correlate with any particular symptoms and physical examination. Additional evaluation of the rectum when and as clinically appropriate. Lobular uterus with presumed fibroids. Slightly thickened endometrium. Please correlate with prior evaluation or dedicated workup with ultrasound or female pelvic MRI as clinically appropriate. Electronically Signed   By: Karen Kays M.D.   On: 04/02/2023 11:17    Procedures Procedures    Medications Ordered in ED Medications  sodium chloride 0.9 % bolus 1,000 mL (1,000 mLs Intravenous New Bag/Given 04/02/23 1019)  ondansetron (ZOFRAN) injection 4 mg (4 mg Intravenous Given 04/02/23 1016)  morphine (PF) 4 MG/ML injection 4 mg (4 mg Intravenous Given 04/02/23 1016)  iohexol (OMNIPAQUE) 300 MG/ML solution 100 mL (100 mLs Intravenous Contrast Given 04/02/23 1020)    ED Course/ Medical Decision Making/ A&P                                 Medical Decision Making Amount and/or Complexity of Data Reviewed Labs: ordered.  Risk Prescription drug management.   47 year old female presents with 2 weeks of persistent pelvic and lower abdominal pain, seen by gynecologist and noted to have a fibroid but sent in for further evaluation due to severity of pain, pelvic exam completed at GYN office yesterday.  Outside of known uterine  fibroid differential also includes urinary tract infection, nephrolithiasis, colitis, diverticulitis, IBD, IBS.  Lab evaluation shows mild leukocytosis of 12.7, no significant electrolyte derangements, normal renal and liver function, UA without signs of infection.  Pregnancy negative, Hemoccult positive  CT abdomen pelvis again demonstrates uterine fibroid, also shows some nonspecific fat stranding around the rectum.  Given patient's reported blood in stool and tenderness on rectal exam Case discussed with Dr. Elnoria Howard with GI.  He suspects patient could have internal fissure, recommends use of topical RectiCare to help reduce pain.  Will see patient in follow-up in the office.  Patient also has scheduled  a follow-up appointment with her gynecologist tomorrow morning to discuss CT results.  Discussed with patient that she should talk about potential urology consultation with her gynecologist given ongoing urinary discomfort.  Provided information for [facility].  Provided short course of pain management encouraged use of NSAIDs routinely and hydrocodone as needed for severe breakthrough pain, Zofran prescribed as needed for nausea.  Stressed the importance of continued outpatient follow-up and discuss strict return precautions.  At this time patient is stable for discharge home.        Final Clinical Impression(s) / ED Diagnoses Final diagnoses:  Lower abdominal pain  Rectal pain    Rx / DC Orders ED Discharge Orders          Ordered    HYDROcodone-acetaminophen (NORCO/VICODIN) 5-325 MG tablet  Every 6 hours PRN        04/02/23 1405    ondansetron (ZOFRAN-ODT) 4 MG disintegrating tablet  Every 6 hours PRN        04/02/23 1405              Dartha Lodge, PA-C 04/03/23 2036    Linwood Dibbles, MD 04/04/23 1538

## 2023-04-07 ENCOUNTER — Encounter: Payer: Self-pay | Admitting: Nurse Practitioner

## 2023-04-16 LAB — HM COLONOSCOPY

## 2023-04-23 ENCOUNTER — Other Ambulatory Visit: Payer: Self-pay | Admitting: Obstetrics and Gynecology

## 2023-04-23 ENCOUNTER — Ambulatory Visit: Payer: 59 | Admitting: Nurse Practitioner

## 2023-04-23 ENCOUNTER — Ambulatory Visit (HOSPITAL_COMMUNITY)
Admission: RE | Admit: 2023-04-23 | Discharge: 2023-04-23 | Disposition: A | Discharge status: Home / Self Care | Payer: 59 | Source: Ambulatory Visit | Attending: Nurse Practitioner | Admitting: Nurse Practitioner

## 2023-04-23 VITALS — BP 119/80 | HR 66 | Temp 97.7°F | Ht 65.0 in | Wt 200.2 lb

## 2023-04-23 DIAGNOSIS — R002 Palpitations: Secondary | ICD-10-CM | POA: Insufficient documentation

## 2023-04-23 DIAGNOSIS — R102 Pelvic and perineal pain: Secondary | ICD-10-CM

## 2023-04-23 DIAGNOSIS — M1712 Unilateral primary osteoarthritis, left knee: Secondary | ICD-10-CM | POA: Diagnosis not present

## 2023-04-23 DIAGNOSIS — R202 Paresthesia of skin: Secondary | ICD-10-CM | POA: Diagnosis not present

## 2023-04-23 NOTE — Progress Notes (Signed)
Established Patient Office Visit  Subjective   Patient ID: Alicia Ross, female    DOB: 05/03/1976  Age: 47 y.o. MRN: [id number]    HPI: Patient presents today with numbness in bilateral arms after lying on her back. She states she will fall asleep on her side, but end up on her back and wake up with numbness and tingling in both arms from her shoulders to her fingers. This has been happening approximately two years, and has more recently been occurring when patient is lying on her back without falling asleep. Pt states the numbness lasts about 10-15 after wakening. Denies weakness, dizziness, or vision changes. Denies any falls, accidents, or injuries. Denies lightheadedness or syncopal episodes. Reports a history of neck and low back pain, and chronic headaches.She has been seeing a [facility].  Pt also has a complaint of pain in her left upper calf. She states this has been going on for the past year or two. Pt believes the pain started in her posterior distal thigh and is now in her upper calf. She sits for extended periods of time at work and notices the pain is worse after sitting. The pain feels like pressure and will feeling throbbing on occasion. Denies any redness, swelling, warmth, numbness, tingling, or weakness in her legs. She does not smoke. Reports walking occasionally for exercise. Reports history of bilateral knee pain.   Reports palpitations occasionally, usually twice a day. This has been going on since pt was in highschool. She feels a rapid heartbeat or her heart skips a beat. This is chronic and has not changed. Denies chest pain or pressure. She has not been seen by cardiology. Pt wants to take time and decide on a referral as this is not acute. She is scheduled for a hysterectomy in December, discussed possibly needing cardiac clearance prior to surgery.     Review of Systems  Constitutional:  Negative for chills and fever.  Respiratory:  Negative for cough, shortness of  breath and wheezing.   Cardiovascular:  Positive for palpitations. Negative for chest pain and leg swelling.  Musculoskeletal:  Positive for back pain and myalgias.       Left upper calf pain, low back pain  Neurological:  Positive for tingling. Negative for dizziness, loss of consciousness, weakness and headaches.      04/23/2023   11:19 AM  Depression screen PHQ 2/9  Decreased Interest 0  Down, Depressed, Hopeless 1  PHQ - 2 Score 1  Altered sleeping 1  Tired, decreased energy 1  Change in appetite 0  Feeling bad or failure about yourself  0  Trouble concentrating 0  Moving slowly or fidgety/restless 0  Suicidal thoughts 0  PHQ-9 Score 3  Difficult doing work/chores Somewhat difficult      04/23/2023   11:19 AM 11/07/2022    2:19 PM 10/12/2020    5:05 PM 10/12/2020    4:54 PM  GAD 7 : Generalized Anxiety Score  Nervous, Anxious, on Edge 1 1 2  0  Control/stop worrying 0 1 1 0  Worry too much - different things 0 0 2 0  Trouble relaxing 1 1 1 1   Restless 0 1 1 1   Easily annoyed or irritable 1 0 1 0  Afraid - awful might happen 0 0 0 0  Total GAD 7 Score 3 4 8 2   Anxiety Difficulty Not difficult at all Somewhat difficult Somewhat difficult Not difficult at all  Objective:     BP 119/80   Pulse 66   Temp 97.7 F (36.5 C)   Ht 5\' 5"  (1.651 m)   Wt 90.8 kg   SpO2 99%   BMI 33.32 kg/m    Physical Exam Vitals and nursing note reviewed.  Constitutional:      Appearance: Normal appearance.  Neck:     Comments: Trapezius muscle tightness, L>R Cardiovascular:     Rate and Rhythm: Normal rate.     Heart sounds: Normal heart sounds. No murmur heard. Pulmonary:     Effort: Pulmonary effort is normal.     Breath sounds: Normal breath sounds. No wheezing.  Musculoskeletal:     Cervical back: Full passive range of motion without pain. No deformity or tenderness. No pain with movement or spinous process tenderness. Normal range of motion.     Thoracic  back: No deformity or tenderness.     Right knee: No swelling or deformity. No tenderness.     Left knee: Crepitus present. No swelling or deformity. No tenderness.     Right lower leg: No edema.     Left lower leg: No edema.     Comments: Hand strength 5+ bilaterally. Sensation grossly intact. Significant crepitus in the left knee as compared to the right.   Skin:    General: Skin is warm and dry.  Neurological:     Mental Status: She is alert.     Motor: No weakness.     Gait: Gait normal.     Deep Tendon Reflexes: Reflexes normal.  Psychiatric:        Mood and Affect: Mood normal.        Behavior: Behavior normal.        Thought Content: Thought content normal.        Judgment: Judgment normal.   No popliteal masses noted. Localized area of tenderness posterior left lower leg. No erythema, warmth or masses.           Assessment & Plan:    1. Tingling of both upper extremities - DG CERVICAL SPINE 2 VIEW - DG Thoracic Spine 2 View  2. Arthritis of left knee Posterior leg pain most likely related to arthritis changes and changes in gait as well as prolonged sitting. No evidence of DVT. Encouraged leg and quadriceps strengthening exercises to help alleviate the posterior leg pain. She is taking naproxen for the pain as needed.   3. Intermittent palpitations Defers cardiology consult at this time. Warning signs reviewed. Seek help immediately if any new or worsening symptoms. Patient understands that she may need cardiology consult if she decides to have a hysterectomy.     Return if symptoms worsen or fail to improve.

## 2023-04-25 ENCOUNTER — Encounter: Payer: Self-pay | Admitting: Nurse Practitioner

## 2023-04-25 NOTE — Progress Notes (Signed)
Subjective:    Patient ID: Alicia Ross, female    DOB: February 13, 1976, 47 y.o.   MRN: [id number]  HPI    Review of Systems     Objective:   Physical Exam        Assessment & Plan:

## 2023-04-28 ENCOUNTER — Encounter: Payer: Self-pay | Admitting: Nurse Practitioner

## 2023-05-04 ENCOUNTER — Ambulatory Visit
Admission: RE | Admit: 2023-05-04 | Discharge: 2023-05-04 | Disposition: A | Discharge status: Home / Self Care | Payer: 59 | Source: Ambulatory Visit | Attending: Obstetrics and Gynecology | Admitting: Obstetrics and Gynecology

## 2023-05-04 DIAGNOSIS — R102 Pelvic and perineal pain: Secondary | ICD-10-CM

## 2023-05-04 MED ORDER — GADOPICLENOL 0.5 MMOL/ML IV SOLN
9.0000 mL | Freq: Once | INTRAVENOUS | Status: AC | PRN
  Administered 2023-05-04: 9 mL via INTRAVENOUS

## 2023-05-13 ENCOUNTER — Encounter: Payer: Self-pay | Admitting: *Deleted

## 2023-05-29 ENCOUNTER — Encounter: Payer: Self-pay | Admitting: Nurse Practitioner

## 2023-05-29 ENCOUNTER — Other Ambulatory Visit: Payer: Self-pay

## 2023-05-29 DIAGNOSIS — R202 Paresthesia of skin: Secondary | ICD-10-CM

## 2023-06-22 ENCOUNTER — Encounter (HOSPITAL_BASED_OUTPATIENT_CLINIC_OR_DEPARTMENT_OTHER): Payer: Self-pay | Admitting: Obstetrics and Gynecology

## 2023-06-22 NOTE — Progress Notes (Addendum)
Spoke w/ via phone for pre-op interview--- pt Lab needs dos----   urine preg      Lab results------ pt has lab appointmtnet 06-23-2023 @ 0900 getting CBC/ T&S COVID test -----patient states asymptomatic no test needed Arrive at ------- 0530 on 06-29-2023 NPO after MN NO Solid Food.  Clear liquids from MN until--- 0430 Med rec completed Medications to take morning of surgery ----- claritin, eye drop if needed (refresh) Diabetic medication ----- n/a Patient instructed no nail polish to be worn day of surgery Patient instructed to bring photo id and insurance card day of surgery Patient aware to have Driver (ride ) / caregiver    for 24 hours after surgery - husband, jeff Patient Special Instructions ----- will pick bag with written instructions at lab appointment , pt will be using dial soap pt allergic to CHG.  Pt asked about if needed to stop taking naproxen prior to surgery since surgeon told to her it was up to anesthesia.  Pt verbalized understanding that this is not up to anesthesia it is up to surgeon , naproxen is an nsaids could cause more bleeding since she is taking twice daily for pain she will need to know what she can take instead prior to surgery, call her surgeon. Pre-Op special Instructions ----- n/a Patient verbalized understanding of instructions that were given at this phone interview. Patient denies chest pain, sob, fever, cough at the interview.   Anesthesia chart review w/ Dr Malen Gauze MDA due to patient interment palpitations.  Dr Malen Gauze stated order pre-op 12 lead EKG, order entered and will be added to her lab appointment 06-23-2003.

## 2023-06-22 NOTE — Progress Notes (Signed)
Your procedure is scheduled on :  Mondy,  06-23-2023  Report to [facility] AT  __5:30_ AM.   Call this number if you have problems the morning of surgery  :[phone]. Any questions prior to surgery call pre-op nurse, Briyan Kleven:  [phone]   OUR ADDRESS IS [address].  WE ARE LOCATED IN [facility]  PLEASE BRING YOUR INSURANCE CARD AND PHOTO ID DAY OF SURGERY.                                     REMEMBER:  Do not eat food after midnight night before surgery.  You may have clear liquid diet from midnight night before surgery until 4:30 AM.  No clear liquids day of surgery after 4:30 AM.  This includes water,  candy/  gum/  mints.    Please brush your teeth morning of surgery and rinse mouth out.    CLEAR LIQUID DIET Allowed      Water                                                                   Coffee and tea, regular and decaf  (NO cream or milk products of any type, may sweeten, no honey)                         Carbonated beverages, regular and diet                                    Sports drinks like Gatorade _____________________________________________________________________     TAKE ONLY THESE MEDICATIONS MORNING OF SURGERY: Loratadine (claritin) Refresh eye drops                                        DO NOT WEAR JEWERLY/  METAL/  PIERCINGS (INCLUDING NO PLASTIC PIERCINGS) DO NOT WEAR LOTIONS, POWDERS, PERFUMES OR NAIL POLISH ON YOUR FINGERNAILS. TOENAIL POLISH IS OK TO WEAR. DO NOT SHAVE FOR 48 HOURS PRIOR TO DAY OF SURGERY.  CONTACTS, GLASSES, OR DENTURES MAY NOT BE WORN TO SURGERY.  REMEMBER: NO SMOKING, VAPING ,  DRUGS OR ALCOHOL FOR 24 HOURS BEFORE YOUR SURGERY.                                    [facility] IS NOT RESPONSIBLE  FOR ANY BELONGINGS.   Since you have allergic reaction to CHG surgical soap (hibiclens).  Please get a bar of Gold Dial Soap , plain , from any store.  Day of surgery let  your pre-op be aware you used dial soap with your showers.                                                                 [facility]  [facility] - Preparing for Surgery Before surgery, you can play an important role.  Because skin is not sterile, your skin needs to be as free of germs as possible.  You can reduce the number of germs on your skin by washing with Dial soap before surgery.   Please follow these instructions carefully:  1.  Shower with Dial Soap the night before surgery and the  morning of Surgery.  2.  If you choose to wash your hair, wash your hair first as usual with your  normal  shampoo.  3.  After you shampoo, rinse your hair and body thoroughly to remove the  shampoo.                                        4.  Use Dial as you would any other liquid soap.  Youwash night before and morning of your surgery.  5.  Apply the Dial Soap to your body ONLY FROM THE NECK DOWN.   Do not use on face/ open                           Wound or open sores. Avoid contact with eyes, ears mouth.  Genitals (private parts) are okay with dial soap                      Wash face with your normal soap.             6.  Wash thoroughly, paying special attention to the area where your surgery  will be performed.  7.  Thoroughly rinse your body with warm water from the neck down.  8.  DO NOT shower/wash with your normal soap after using and rinsing off  Dial Soap.             9.  Pat yourself dry with a clean towel.            10.  Wear clean pajamas.            11.  Place clean sheets on your bed the night of your first shower and do not  sleep with pets. Day of Surgery : Do not apply any lotions/ powders the morning of surgery.  Please wear clean clothes to the hospital/surgery center.    YOUR SURGEON MAY HAVE REQUESTED EXTENDED RECOVERY TIME AFTER YOUR SURGERY. IT COULD BE A  JUST A FEW HOURS  UP TO AN OVERNIGHT STAY.  YOUR SURGEON SHOULD HAVE DISCUSSED THIS WITH YOU PRIOR TO YOUR SURGERY. IN THE  EVENT YOU NEED TO STAY OVERNIGHT PLEASE REFER TO THE FOLLOWING GUIDELINES. YOU MAY HAVE UP TO 4 VISITORS  MAY VISIT IN THE EXTENDED RECOVERY ROOM UNTIL 800 PM ONLY.  ONE  VISITOR AGE 71 AND OVER MAY SPEND THE NIGHT AND MUST BE IN EXTENDED RECOVERY ROOM NO LATER THAN 800 PM . YOUR DISCHARGE TIME AFTER YOU SPEND THE NIGHT IS 900 AM THE MORNING AFTER YOUR SURGERY. YOU MAY PACK A SMALL OVERNIGHT BAG WITH TOILETRIES FOR YOUR OVERNIGHT STAY IF YOU WISH.  REGARDLESS OF IF YOU STAY OVER NIGHT OR ARE DISCHARGED THE SAME DAY YOU WILL BE REQUIRED TO HAVE A RESPONSIBLE ADULT (18 YRS OLD OR OLDER) STAY WITH YOU FOR AT LEAST THE FIRST 24 HOURS  YOUR PRESCRIPTION MEDICATIONS WILL BE PROVIDED DURING [facility]  STAY.  ________________________________________________________________________

## 2023-06-23 ENCOUNTER — Encounter (HOSPITAL_COMMUNITY)
Admission: RE | Admit: 2023-06-23 | Discharge: 2023-06-23 | Disposition: A | Discharge status: Home / Self Care | Payer: 59 | Source: Ambulatory Visit | Attending: Obstetrics and Gynecology | Admitting: Obstetrics and Gynecology

## 2023-06-23 DIAGNOSIS — N920 Excessive and frequent menstruation with regular cycle: Secondary | ICD-10-CM | POA: Insufficient documentation

## 2023-06-23 DIAGNOSIS — Z01818 Encounter for other preprocedural examination: Secondary | ICD-10-CM | POA: Diagnosis not present

## 2023-06-23 DIAGNOSIS — R9431 Abnormal electrocardiogram [ECG] [EKG]: Secondary | ICD-10-CM | POA: Insufficient documentation

## 2023-06-23 DIAGNOSIS — Z01812 Encounter for preprocedural laboratory examination: Secondary | ICD-10-CM | POA: Diagnosis present

## 2023-06-23 DIAGNOSIS — Z01810 Encounter for preprocedural cardiovascular examination: Secondary | ICD-10-CM | POA: Diagnosis present

## 2023-06-23 LAB — CBC
HCT: 40.9 % (ref 36.0–46.0)
Hemoglobin: 13.4 g/dL (ref 12.0–15.0)
MCH: 32.1 pg (ref 26.0–34.0)
MCHC: 32.8 g/dL (ref 30.0–36.0)
MCV: 97.8 fL (ref 80.0–100.0)
Platelets: 173 10*3/uL (ref 150–400)
RBC: 4.18 MIL/uL (ref 3.87–5.11)
RDW: 12.3 % (ref 11.5–15.5)
WBC: 6.6 10*3/uL (ref 4.0–10.5)
nRBC: 0 % (ref 0.0–0.2)

## 2023-06-26 NOTE — H&P (Signed)
Preoperative History and Physical  Alicia Ross is an 47 y.o. female. G1P1001 who presents for scheduled TLH, BSO for treatment of fibroids, menorrhagia, and pelvic pain. She was counseled and would like bilateral oophorectomy. Will be for estradiol patch postoperatively.   Pertinent Gynecological History: Menses: menorrhagia DES exposure: unknown Blood transfusions: none S/p BTL for contracpetion Sexually transmitted diseases: no past history Last mammogram: normal Date: 01/2023 Last pap: normal Date: 09/2021 OB History: G1, P1   Menstrual History: Patient's last menstrual period was 06/22/2023 (exact date).    Past Medical History:  Diagnosis Date   Chronic headaches    Family history of breast cancer    Family history of melanoma    Family history of pancreatic cancer    Family history of uterine cancer    GERD (gastroesophageal reflux disease)    Intermittent palpitations    06-22-2023  per pt told her pcp about having intermittant palpitations since high school very occasionally (noted a pcp office note in epic 04-23-2023) but pt stated past 4 months episodes where heart bear a little faster has to catch her breath, mostly happens at rest,  this happens dialy 6-12 times,  pt stated was referred to cardiology has appt [facility]/ [facility] ,  pt denies sob w/ any activity   Menorrhagia    Pelvic pain    PONV (postoperative nausea and vomiting)    Sleep related teeth grinding    uses mouth guard   Tingling of both upper extremities    06-22-2023  per pt when she lays on her back sleeping wakes up, her upper her tingling but goes away quickly when rolls off back   Uterine fibroid     Past Surgical History:  Procedure Laterality Date   CESAREAN SECTION  2008   INCISIONAL HERNIA REPAIR N/A 06/27/2020   Procedure: HERNIA REPAIR INCISIONAL;OPEN WITH MESH;  Surgeon: Lucretia Roers, MD;  Location: [facility];  Service: General;  Laterality: N/A;   KNEE ARTHROSCOPY Left 2006    LAPAROSCOPIC TUBAL LIGATION Bilateral 08/25/2014   Procedure: LAPAROSCOPIC TUBAL LIGATION with Filshie Clips;  Surgeon: Zelphia Cairo, MD;  Location[facility] ;  Service: Gynecology;  Laterality: Bilateral;   WISDOM TOOTH EXTRACTION  1995   WRIST GANGLION EXCISION Left 1994  & 2004   in 2004  also removed spurs    Family History  Problem Relation Age of Onset   Melanoma Mother 35   Uterine cancer Mother 73   Hemochromatosis Father    Pancreatic cancer Maternal Uncle 23   Breast cancer Paternal Aunt 57   Arthritis Maternal Grandmother    Hemochromatosis Paternal Grandmother    Breast cancer Paternal Grandmother 65   Breast cancer Other 95   Breast cancer Other    Hemochromatosis Cousin        pat first cousin    Social History:  reports that she has never smoked. She has never used smokeless tobacco. She reports that she does not drink alcohol and does not use drugs.  Allergies:  Allergies  Allergen Reactions   Hibiclens [Chlorhexidine Gluconate] Hives, Shortness Of Breath, Itching and Swelling   Codeine Other (See Comments)    GI UPSET   Colace [Docusate] Hives    No medications prior to admission.      06/22/2023   12:26 PM 04/23/2023   11:02 AM 04/02/2023    1:00 PM  Vitals with BMI  Height 5\' 5"  5\' 5"    Weight 197 lbs 200 lbs  3 oz   BMI 32.78 33.31   Systolic  119 139  Diastolic  80 89  Pulse  66 71   Height 5\' 5"  (1.651 m), weight 89.4 kg, last menstrual period 06/22/2023. Constitutional *General Appearance: healthy-appearing, well-nourished, well-developed  Head Head: normocephalic  Abdomen *Inspection/Palpation/Auscultation: non-distended, no tenderness, no rebound, no guarding, soft, no hepatomegaly, no splenomegaly *Hernia: none palpated  Female Genitalia Vulva: no masses, no atrophy, no lesions Labia Majora: normal, no erythema, no excoriation, no atrophy, no discoloration, no lesions, no vesicles/ ulcers, no masses, no swelling,  no tenderness Introitus: normal Bartholin's Gland: normal *Vagina: no discharge, no erythema, no atrophy, no lesions, no ulcers, no swelling, no masses, no tenderness, no prolapse, blood present *Cervix: grossly normal, no lesions, no discharge, no cervical motion tenderness, blood present *Uterus: normal contour, midline, no uterine prolapse, mobile, non-tender, fibroids, enlarged 10 weeks size *Urethral Meatus/ Urethra: normal meatus, no discharge, well supported urethra, no masses, no tenderness *Bladder: non-distended, no palpable mass, non-tender *Adnexa/Parametria: no mass palpable, no tenderness  Psychiatric *Orientation: to person, to place, to time *Mood and Affect: active and alert, normal mood, normal affect  Recent Results (from the past 2160 hours)  Comprehensive metabolic panel     Status: Abnormal   Collection Time: 04/02/23  8:21 AM  Result Value Ref Range   Sodium 137 135 - 145 mmol/L   Potassium 3.8 3.5 - 5.1 mmol/L   Chloride 105 98 - 111 mmol/L   CO2 21 (L) 22 - 32 mmol/L   Glucose, Bld 104 (H) 70 - 99 mg/dL    Comment: Glucose reference range applies only to samples taken after fasting for at least 8 hours.   BUN 18 6 - 20 mg/dL   Creatinine, Ser 8.75 0.44 - 1.00 mg/dL   Calcium 8.9 8.9 - 64.3 mg/dL   Total Protein 7.4 6.5 - 8.1 g/dL   Albumin 4.3 3.5 - 5.0 g/dL   AST 16 15 - 41 U/L   ALT 19 0 - 44 U/L   Alkaline Phosphatase 54 38 - 126 U/L   Total Bilirubin 1.2 0.3 - 1.2 mg/dL   GFR, Estimated >32 >95 mL/min    Comment: (NOTE) Calculated using the CKD-EPI Creatinine Equation (2021)    Anion gap 11 5 - 15    Comment: Performed at  [facility], [address] [place], [place] [address]  CBC     Status: Abnormal   Collection Time: 04/02/23  8:21 AM  Result Value Ref Range   WBC 12.7 (H) 4.0 - 10.5 K/uL   RBC 4.34 3.87 - 5.11 MIL/uL   Hemoglobin 13.9 12.0 - 15.0 g/dL   HCT 66.0 63.0 - 16.0 %   MCV 93.8 80.0 - 100.0 fL   MCH 32.0  26.0 - 34.0 pg   MCHC 34.2 30.0 - 36.0 g/dL   RDW 10.9 32.3 - 55.7 %   Platelets 203 150 - 400 K/uL   nRBC 0.0 0.0 - 0.2 %    Comment: Performed at [facility], [address] [place], [place] [address]  Urinalysis, Routine w reflex microscopic -Urine, Clean Catch     Status: Abnormal   Collection Time: 04/02/23  8:21 AM  Result Value Ref Range   Color, Urine YELLOW YELLOW   APPearance CLEAR CLEAR   Specific Gravity, Urine 1.023 1.005 - 1.030   pH 5.0 5.0 - 8.0   Glucose, UA NEGATIVE NEGATIVE mg/dL   Hgb urine dipstick SMALL (A) NEGATIVE  Bilirubin Urine NEGATIVE NEGATIVE   Ketones, ur NEGATIVE NEGATIVE mg/dL   Protein, ur NEGATIVE NEGATIVE mg/dL   Nitrite NEGATIVE NEGATIVE   Leukocytes,Ua NEGATIVE NEGATIVE   RBC / HPF 0-5 0 - 5 RBC/hpf   WBC, UA 0-5 0 - 5 WBC/hpf   Bacteria, UA RARE (A) NONE SEEN   Squamous Epithelial / HPF 0-5 0 - 5 /HPF   Mucus PRESENT     Comment: Performed at [facility], [address], [place], [place] [address]  hCG, quantitative, pregnancy     Status: None   Collection Time: 04/02/23  8:21 AM  Result Value Ref Range   hCG, Beta Chain, Quant, S 4 <5 mIU/mL    Comment:          GEST. AGE      CONC.  (mIU/mL)   <=1 WEEK        5 - 50     2 WEEKS       50 - 500     3 WEEKS       100 - 10,000     4 WEEKS     1,000 - 30,000     5 WEEKS     3,500 - 115,000   6-8 WEEKS     12,000 - 270,000    12 WEEKS     15,000 - 220,000        FEMALE AND NON-PREGNANT FEMALE:     LESS THAN 5 mIU/mL Performed at [facility], [address] [place], [place] [address]   POC occult blood, ED Provider will collect     Status: Abnormal   Collection Time: 04/02/23 12:34 PM  Result Value Ref Range   Fecal Occult Bld POSITIVE (A) NEGATIVE  HM COLONOSCOPY     Status: None   Collection Time: 04/16/23  2:52 PM  Result Value Ref Range   HM Colonoscopy See Report (in chart) See Report (in chart), Patient Reported     Comment: Abstracted by HIM  CBC     Status: None   Collection Time: 06/23/23  9:17 AM  Result Value Ref Range   WBC 6.6 4.0 - 10.5 K/uL   RBC 4.18 3.87 - 5.11 MIL/uL   Hemoglobin 13.4 12.0 - 15.0 g/dL   HCT 86.5 78.4 - 69.6 %   MCV 97.8 80.0 - 100.0 fL   MCH 32.1 26.0 - 34.0 pg   MCHC 32.8 30.0 - 36.0 g/dL   RDW 29.5 28.4 - 13.2 %   Platelets 173 150 - 400 K/uL   nRBC 0.0 0.0 - 0.2 %    Comment: Performed at [facility], [address] [place], [place] [address]  Type and screen [facility]     Status: None   Collection Time: 06/23/23  9:17 AM  Result Value Ref Range   ABO/RH(D) A POS    Antibody Screen NEG    Sample Expiration 07/07/2023,2359    Extend sample reason      NO TRANSFUSIONS OR PREGNANCY IN THE PAST 3 MONTHS Performed at [facility], [address], [place], [place] [address]     Assessment/Plan: Alicia Ross is an 47 y.o. female. G1P1001 who presents for scheduled TLH, BSO for treatment of fibroids, menorrhagia, and pelvic pain.  She was counseled and would like bilateral oophorectomy. Will be for estradiol patch postoperatively.  Risks discussed including infection, bleeding, damage to surrounding structures, need for additional procedures, postoperative  DVT. She understands that any laparoscopic procedure has risk of needing to be performed open if difficulty controlling bleeding or visualizing structures. R/b/a discussed with patient. All questions answered. Consents signed. Plan for discharge from PACU.  [name] 06/26/2023, 9:58 AM

## 2023-06-28 NOTE — Anesthesia Preprocedure Evaluation (Signed)
Anesthesia Evaluation  Patient identified by MRN, date of birth, ID band Patient awake    Reviewed: Allergy & Precautions, NPO status , Patient's Chart, lab work & pertinent test results  History of Anesthesia Complications (+) PONV and history of anesthetic complications  Airway Mallampati: II  TM Distance: >3 FB Neck ROM: Full    Dental no notable dental hx.    Pulmonary neg pulmonary ROS   Pulmonary exam normal        Cardiovascular negative cardio ROS Normal cardiovascular exam     Neuro/Psych  Headaches    GI/Hepatic Neg liver ROS,GERD  Controlled,,  Endo/Other  negative endocrine ROS    Renal/GU negative Renal ROS  negative genitourinary   Musculoskeletal  (+) Arthritis ,    Abdominal   Peds  Hematology negative hematology ROS (+)   Anesthesia Other Findings Day of surgery medications reviewed with patient.  Reproductive/Obstetrics                              Anesthesia Physical Anesthesia Plan  ASA: 2  Anesthesia Plan: General   Post-op Pain Management: Tylenol PO (pre-op)*   Induction: Intravenous  PONV Risk Score and Plan: 4 or greater and Treatment may vary due to age or medical condition, Ondansetron, Dexamethasone, Midazolam, Scopolamine patch - Pre-op, Propofol infusion and TIVA  Airway Management Planned: Oral ETT  Additional Equipment: None  Intra-op Plan:   Post-operative Plan: Extubation in OR  Informed Consent: I have reviewed the patients History and Physical, chart, labs and discussed the procedure including the risks, benefits and alternatives for the proposed anesthesia with the patient or authorized representative who has indicated his/her understanding and acceptance.     Dental advisory given  Plan Discussed with: CRNA  Anesthesia Plan Comments:         Anesthesia Quick Evaluation

## 2023-06-29 ENCOUNTER — Ambulatory Visit (HOSPITAL_BASED_OUTPATIENT_CLINIC_OR_DEPARTMENT_OTHER): Payer: 59 | Admitting: Anesthesiology

## 2023-06-29 ENCOUNTER — Encounter (HOSPITAL_BASED_OUTPATIENT_CLINIC_OR_DEPARTMENT_OTHER): Payer: Self-pay | Admitting: Obstetrics and Gynecology

## 2023-06-29 ENCOUNTER — Other Ambulatory Visit: Payer: Self-pay

## 2023-06-29 ENCOUNTER — Encounter (HOSPITAL_BASED_OUTPATIENT_CLINIC_OR_DEPARTMENT_OTHER)
Admission: RE | Disposition: A | Discharge status: Home / Self Care | Payer: Self-pay | Source: Ambulatory Visit | Attending: Obstetrics and Gynecology

## 2023-06-29 ENCOUNTER — Ambulatory Visit (HOSPITAL_BASED_OUTPATIENT_CLINIC_OR_DEPARTMENT_OTHER)
Admission: RE | Admit: 2023-06-29 | Discharge: 2023-06-30 | Disposition: A | Discharge status: Home / Self Care | Payer: 59 | Source: Ambulatory Visit | Attending: Obstetrics and Gynecology | Admitting: Obstetrics and Gynecology

## 2023-06-29 DIAGNOSIS — K660 Peritoneal adhesions (postprocedural) (postinfection): Secondary | ICD-10-CM | POA: Diagnosis not present

## 2023-06-29 DIAGNOSIS — D259 Leiomyoma of uterus, unspecified: Secondary | ICD-10-CM | POA: Diagnosis present

## 2023-06-29 DIAGNOSIS — N8311 Corpus luteum cyst of right ovary: Secondary | ICD-10-CM | POA: Insufficient documentation

## 2023-06-29 DIAGNOSIS — N8003 Adenomyosis of the uterus: Secondary | ICD-10-CM | POA: Insufficient documentation

## 2023-06-29 DIAGNOSIS — Z01818 Encounter for other preprocedural examination: Secondary | ICD-10-CM

## 2023-06-29 DIAGNOSIS — N838 Other noninflammatory disorders of ovary, fallopian tube and broad ligament: Secondary | ICD-10-CM | POA: Insufficient documentation

## 2023-06-29 DIAGNOSIS — N920 Excessive and frequent menstruation with regular cycle: Secondary | ICD-10-CM

## 2023-06-29 DIAGNOSIS — D251 Intramural leiomyoma of uterus: Secondary | ICD-10-CM | POA: Diagnosis not present

## 2023-06-29 DIAGNOSIS — Z9079 Acquired absence of other genital organ(s): Secondary | ICD-10-CM

## 2023-06-29 DIAGNOSIS — N72 Inflammatory disease of cervix uteri: Secondary | ICD-10-CM | POA: Diagnosis not present

## 2023-06-29 DIAGNOSIS — Z90710 Acquired absence of both cervix and uterus: Secondary | ICD-10-CM

## 2023-06-29 HISTORY — DX: Pelvic and perineal pain unspecified side: R10.20

## 2023-06-29 HISTORY — DX: Leiomyoma of uterus, unspecified: D25.9

## 2023-06-29 HISTORY — DX: Paresthesia of skin: R20.2

## 2023-06-29 HISTORY — PX: TOTAL LAPAROSCOPIC HYSTERECTOMY WITH SALPINGECTOMY: SHX6742

## 2023-06-29 HISTORY — DX: Pelvic and perineal pain: R10.2

## 2023-06-29 HISTORY — DX: Nausea with vomiting, unspecified: R11.2

## 2023-06-29 HISTORY — PX: LAPAROSCOPIC LYSIS OF ADHESIONS: SHX5905

## 2023-06-29 HISTORY — DX: Excessive and frequent menstruation with regular cycle: N92.0

## 2023-06-29 HISTORY — DX: Other chronic pain: G89.29

## 2023-06-29 HISTORY — DX: Sleep related bruxism: G47.63

## 2023-06-29 HISTORY — DX: Headache, unspecified: R51.9

## 2023-06-29 HISTORY — DX: Other specified postprocedural states: Z98.890

## 2023-06-29 HISTORY — DX: Palpitations: R00.2

## 2023-06-29 LAB — TYPE AND SCREEN
ABO/RH(D): A POS
Antibody Screen: NEGATIVE

## 2023-06-29 LAB — POCT PREGNANCY, URINE: Preg Test, Ur: NEGATIVE

## 2023-06-29 LAB — ABO/RH: ABO/RH(D): A POS

## 2023-06-29 SURGERY — HYSTERECTOMY, TOTAL, LAPAROSCOPIC, WITH SALPINGECTOMY
Anesthesia: General | Site: Pelvis

## 2023-06-29 MED ORDER — OXYCODONE HCL 5 MG PO TABS
ORAL_TABLET | ORAL | Status: AC
  Filled 2023-06-29: qty 1

## 2023-06-29 MED ORDER — SCOPOLAMINE 1 MG/3DAYS TD PT72
MEDICATED_PATCH | TRANSDERMAL | Status: AC
  Filled 2023-06-29: qty 1

## 2023-06-29 MED ORDER — OXYCODONE HCL 5 MG PO TABS
5.0000 mg | ORAL_TABLET | ORAL | Status: DC | PRN
Start: 2023-06-29 — End: 2023-06-30
  Administered 2023-06-29 – 2023-06-30 (×6): 5 mg via ORAL

## 2023-06-29 MED ORDER — KETOROLAC TROMETHAMINE 30 MG/ML IJ SOLN
INTRAMUSCULAR | Status: AC
  Filled 2023-06-29: qty 1

## 2023-06-29 MED ORDER — PROPOFOL 500 MG/50ML IV EMUL
INTRAVENOUS | Status: DC | PRN
  Administered 2023-06-29: 200 ug/kg/min via INTRAVENOUS

## 2023-06-29 MED ORDER — ACETAMINOPHEN 500 MG PO TABS
1000.0000 mg | ORAL_TABLET | Freq: Four times a day (QID) | ORAL | Status: DC
  Administered 2023-06-29 – 2023-06-30 (×4): 1000 mg via ORAL

## 2023-06-29 MED ORDER — DEXMEDETOMIDINE HCL IN NACL 80 MCG/20ML IV SOLN
INTRAVENOUS | Status: AC
  Filled 2023-06-29: qty 20

## 2023-06-29 MED ORDER — SCOPOLAMINE 1 MG/3DAYS TD PT72
1.0000 | MEDICATED_PATCH | Freq: Once | TRANSDERMAL | Status: DC
  Administered 2023-06-29: 1.5 mg via TRANSDERMAL

## 2023-06-29 MED ORDER — POLYETHYLENE GLYCOL 3350 17 G PO PACK: 17.0000 g | PACK | Freq: Every day | ORAL | 0 refills | Status: DC

## 2023-06-29 MED ORDER — GLYCOPYRROLATE PF 0.2 MG/ML IJ SOSY
PREFILLED_SYRINGE | INTRAMUSCULAR | Status: DC | PRN
  Administered 2023-06-29: .2 mg via INTRAVENOUS

## 2023-06-29 MED ORDER — POVIDONE-IODINE 10 % EX SWAB: 2.0000 | Freq: Once | CUTANEOUS | Status: DC

## 2023-06-29 MED ORDER — ONDANSETRON HCL 4 MG/2ML IJ SOLN: 4.0000 mg | Freq: Four times a day (QID) | INTRAMUSCULAR | Status: DC | PRN

## 2023-06-29 MED ORDER — DEXAMETHASONE SODIUM PHOSPHATE 10 MG/ML IJ SOLN
INTRAMUSCULAR | Status: AC
  Filled 2023-06-29: qty 1

## 2023-06-29 MED ORDER — ONDANSETRON HCL 4 MG PO TABS: 4.0000 mg | ORAL_TABLET | Freq: Four times a day (QID) | ORAL | Status: DC | PRN

## 2023-06-29 MED ORDER — HYDROMORPHONE HCL 1 MG/ML IJ SOLN
INTRAMUSCULAR | Status: AC
  Filled 2023-06-29: qty 1

## 2023-06-29 MED ORDER — KETOROLAC TROMETHAMINE 30 MG/ML IJ SOLN
INTRAMUSCULAR | Status: AC
Start: 2023-06-29 — End: ?
  Filled 2023-06-29: qty 1

## 2023-06-29 MED ORDER — HEMOSTATIC AGENTS (NO CHARGE) OPTIME
TOPICAL | Status: DC | PRN
  Administered 2023-06-29: 1 via TOPICAL

## 2023-06-29 MED ORDER — LIDOCAINE HCL (PF) 2 % IJ SOLN
INTRAMUSCULAR | Status: AC
  Filled 2023-06-29: qty 5

## 2023-06-29 MED ORDER — ACETAMINOPHEN 500 MG PO TABS
ORAL_TABLET | ORAL | Status: AC
  Filled 2023-06-29: qty 2

## 2023-06-29 MED ORDER — LACTATED RINGERS IV SOLN: INTRAVENOUS | Status: AC

## 2023-06-29 MED ORDER — KETOROLAC TROMETHAMINE 30 MG/ML IJ SOLN: 30.0000 mg | Freq: Once | INTRAMUSCULAR | Status: AC | PRN

## 2023-06-29 MED ORDER — LIDOCAINE 2% (20 MG/ML) 5 ML SYRINGE
INTRAMUSCULAR | Status: DC | PRN
  Administered 2023-06-29: 100 mg via INTRAVENOUS

## 2023-06-29 MED ORDER — IBUPROFEN 600 MG PO TABS: 600.0000 mg | ORAL_TABLET | Freq: Four times a day (QID) | ORAL | 1 refills | Status: DC

## 2023-06-29 MED ORDER — ROCURONIUM BROMIDE 10 MG/ML (PF) SYRINGE
PREFILLED_SYRINGE | INTRAVENOUS | Status: AC
  Filled 2023-06-29: qty 10

## 2023-06-29 MED ORDER — SUGAMMADEX SODIUM 200 MG/2ML IV SOLN
INTRAVENOUS | Status: DC | PRN
  Administered 2023-06-29: 200 mg via INTRAVENOUS

## 2023-06-29 MED ORDER — PROPOFOL 1000 MG/100ML IV EMUL
INTRAVENOUS | Status: AC
  Filled 2023-06-29: qty 100

## 2023-06-29 MED ORDER — SIMETHICONE 80 MG PO CHEW: 80.0000 mg | CHEWABLE_TABLET | Freq: Four times a day (QID) | ORAL | Status: DC | PRN

## 2023-06-29 MED ORDER — PANTOPRAZOLE SODIUM 40 MG PO TBEC
DELAYED_RELEASE_TABLET | ORAL | Status: AC
  Filled 2023-06-29: qty 1

## 2023-06-29 MED ORDER — ONDANSETRON HCL 4 MG/2ML IJ SOLN
INTRAMUSCULAR | Status: AC
  Filled 2023-06-29: qty 2

## 2023-06-29 MED ORDER — ONDANSETRON HCL 4 MG/2ML IJ SOLN
INTRAMUSCULAR | Status: DC | PRN
  Administered 2023-06-29: 4 mg via INTRAVENOUS

## 2023-06-29 MED ORDER — EPHEDRINE 5 MG/ML INJ
INTRAVENOUS | Status: AC
  Filled 2023-06-29: qty 5

## 2023-06-29 MED ORDER — PANTOPRAZOLE SODIUM 40 MG PO TBEC
40.0000 mg | DELAYED_RELEASE_TABLET | Freq: Every day | ORAL | Status: DC
  Administered 2023-06-29: 40 mg via ORAL

## 2023-06-29 MED ORDER — POLYETHYLENE GLYCOL 3350 17 G PO PACK
17.0000 g | PACK | Freq: Every day | ORAL | Status: DC
  Administered 2023-06-29: 17 g via ORAL

## 2023-06-29 MED ORDER — POLYETHYLENE GLYCOL 3350 17 G PO PACK
PACK | ORAL | Status: AC
  Filled 2023-06-29: qty 1

## 2023-06-29 MED ORDER — ALUM & MAG HYDROXIDE-SIMETH 200-200-20 MG/5ML PO SUSP: 30.0000 mL | ORAL | Status: DC | PRN

## 2023-06-29 MED ORDER — ROCURONIUM BROMIDE 10 MG/ML (PF) SYRINGE
PREFILLED_SYRINGE | INTRAVENOUS | Status: DC | PRN
  Administered 2023-06-29: 10 mg via INTRAVENOUS
  Administered 2023-06-29 (×2): 20 mg via INTRAVENOUS
  Administered 2023-06-29: 10 mg via INTRAVENOUS
  Administered 2023-06-29: 60 mg via INTRAVENOUS

## 2023-06-29 MED ORDER — DROPERIDOL 2.5 MG/ML IJ SOLN
0.6250 mg | Freq: Once | INTRAMUSCULAR | Status: AC | PRN
  Administered 2023-06-29: 0.625 mg via INTRAVENOUS

## 2023-06-29 MED ORDER — BUPIVACAINE HCL (PF) 0.25 % IJ SOLN
INTRAMUSCULAR | Status: DC | PRN
  Administered 2023-06-29: 20 mL

## 2023-06-29 MED ORDER — FENTANYL CITRATE (PF) 100 MCG/2ML IJ SOLN
INTRAMUSCULAR | Status: AC
  Filled 2023-06-29: qty 2

## 2023-06-29 MED ORDER — STERILE WATER FOR IRRIGATION IR SOLN
Status: DC | PRN
  Administered 2023-06-29: 500 mL

## 2023-06-29 MED ORDER — HYDROMORPHONE HCL 1 MG/ML IJ SOLN
0.2500 mg | INTRAMUSCULAR | Status: DC | PRN
Start: 2023-06-29 — End: 2023-06-30
  Administered 2023-06-29: 0.5 mg via INTRAVENOUS
  Administered 2023-06-29: 0.25 mg via INTRAVENOUS

## 2023-06-29 MED ORDER — SODIUM CHLORIDE 0.9 % IR SOLN
Status: DC | PRN
  Administered 2023-06-29: 1000 mL

## 2023-06-29 MED ORDER — KETOROLAC TROMETHAMINE 30 MG/ML IJ SOLN
30.0000 mg | Freq: Four times a day (QID) | INTRAMUSCULAR | Status: DC
  Administered 2023-06-29 – 2023-06-30 (×3): 30 mg via INTRAVENOUS

## 2023-06-29 MED ORDER — EPHEDRINE SULFATE-NACL 50-0.9 MG/10ML-% IV SOSY
PREFILLED_SYRINGE | INTRAVENOUS | Status: DC | PRN
  Administered 2023-06-29: 10 mg via INTRAVENOUS

## 2023-06-29 MED ORDER — CEFAZOLIN SODIUM-DEXTROSE 2-4 GM/100ML-% IV SOLN
2.0000 g | INTRAVENOUS | Status: AC
  Administered 2023-06-29: 2 g via INTRAVENOUS

## 2023-06-29 MED ORDER — FENTANYL CITRATE (PF) 100 MCG/2ML IJ SOLN
INTRAMUSCULAR | Status: DC | PRN
  Administered 2023-06-29: 50 ug via INTRAVENOUS
  Administered 2023-06-29: 25 ug via INTRAVENOUS
  Administered 2023-06-29 (×2): 50 ug via INTRAVENOUS
  Administered 2023-06-29: 25 ug via INTRAVENOUS

## 2023-06-29 MED ORDER — CEFAZOLIN SODIUM-DEXTROSE 2-4 GM/100ML-% IV SOLN
INTRAVENOUS | Status: AC
  Filled 2023-06-29: qty 100

## 2023-06-29 MED ORDER — ACETAMINOPHEN 500 MG PO TABS
1000.0000 mg | ORAL_TABLET | Freq: Once | ORAL | Status: AC
  Administered 2023-06-29: 1000 mg via ORAL

## 2023-06-29 MED ORDER — MIDAZOLAM HCL 5 MG/5ML IJ SOLN
INTRAMUSCULAR | Status: DC | PRN
  Administered 2023-06-29: 2 mg via INTRAVENOUS

## 2023-06-29 MED ORDER — PROPOFOL 10 MG/ML IV BOLUS
INTRAVENOUS | Status: DC | PRN
  Administered 2023-06-29: 200 mg via INTRAVENOUS

## 2023-06-29 MED ORDER — PROPOFOL 10 MG/ML IV BOLUS
INTRAVENOUS | Status: AC
  Filled 2023-06-29: qty 20

## 2023-06-29 MED ORDER — OXYCODONE HCL 5 MG PO TABS: 5.0000 mg | ORAL_TABLET | ORAL | 0 refills | Status: DC | PRN

## 2023-06-29 MED ORDER — IBUPROFEN 200 MG PO TABS: 600.0000 mg | ORAL_TABLET | Freq: Four times a day (QID) | ORAL | Status: DC

## 2023-06-29 MED ORDER — ACETAMINOPHEN 500 MG PO TABS: 500.0000 mg | ORAL_TABLET | ORAL | 1 refills | Status: AC | PRN

## 2023-06-29 MED ORDER — MIDAZOLAM HCL 2 MG/2ML IJ SOLN
INTRAMUSCULAR | Status: AC
  Filled 2023-06-29: qty 2

## 2023-06-29 MED ORDER — DEXMEDETOMIDINE HCL IN NACL 80 MCG/20ML IV SOLN
INTRAVENOUS | Status: DC | PRN
  Administered 2023-06-29: 12 ug via INTRAVENOUS

## 2023-06-29 MED ORDER — KETOROLAC TROMETHAMINE 30 MG/ML IJ SOLN
INTRAMUSCULAR | Status: DC | PRN
  Administered 2023-06-29: 30 mg via INTRAVENOUS

## 2023-06-29 MED ORDER — DEXAMETHASONE SODIUM PHOSPHATE 10 MG/ML IJ SOLN
INTRAMUSCULAR | Status: DC | PRN
  Administered 2023-06-29: 10 mg via INTRAVENOUS

## 2023-06-29 MED ORDER — DROPERIDOL 2.5 MG/ML IJ SOLN
INTRAMUSCULAR | Status: AC
  Filled 2023-06-29: qty 2

## 2023-06-29 SURGICAL SUPPLY — 57 items
APPLICATOR ARISTA FLEXITIP XL (MISCELLANEOUS) IMPLANT
APPLICATOR SURGIFLO ENDO (HEMOSTASIS) IMPLANT
BENZOIN TINCTURE PRP APPL 2/3 (GAUZE/BANDAGES/DRESSINGS) ×2 IMPLANT
CABLE HIGH FREQUENCY MONO STRZ (ELECTRODE) IMPLANT
CHLORAPREP W/TINT 26 (MISCELLANEOUS) ×2 IMPLANT
COVER MAYO STAND STRL (DRAPES) ×2 IMPLANT
COVER SURGICAL LIGHT HANDLE (MISCELLANEOUS) IMPLANT
DEFOGGER SCOPE WARMER CLEARIFY (MISCELLANEOUS) IMPLANT
DRSG TEGADERM 2-3/8X2-3/4 SM (GAUZE/BANDAGES/DRESSINGS) ×8 IMPLANT
DURAPREP 26ML APPLICATOR (WOUND CARE) IMPLANT
GAUZE SPONGE 2X2 8PLY STRL LF (GAUZE/BANDAGES/DRESSINGS) ×8 IMPLANT
GAUZE SPONGE 2X2 STRL 8-PLY (GAUZE/BANDAGES/DRESSINGS) IMPLANT
GLOVE BIO SURGEON STRL SZ7 (GLOVE) IMPLANT
GLOVE BIOGEL PI IND STRL 6 (GLOVE) ×4 IMPLANT
GLOVE BIOGEL PI IND STRL 7.0 (GLOVE) IMPLANT
GLOVE SS PI 5.5 STRL (GLOVE) ×4 IMPLANT
GLOVE SURG POLYISO LF SZ5.5 (GLOVE) IMPLANT
GOWN STRL REUS W/ TWL LRG LVL3 (GOWN DISPOSABLE) ×4 IMPLANT
GOWN STRL REUS W/TWL LRG LVL3 (GOWN DISPOSABLE) IMPLANT
HEMOSTAT ARISTA ABSORB 3G PWDR (HEMOSTASIS) IMPLANT
HIBICLENS CHG 4% 4OZ BTL (MISCELLANEOUS) ×2 IMPLANT
IRRIG SUCT STRYKERFLOW 2 WTIP (MISCELLANEOUS) ×2 IMPLANT
IRRIGATION SUCT STRKRFLW 2 WTP (MISCELLANEOUS) ×4 IMPLANT
KIT PINK PAD W/HEAD ARE REST (MISCELLANEOUS) ×2 IMPLANT
KIT PINK PAD W/HEAD ARM REST (MISCELLANEOUS) ×2 IMPLANT
KIT TURNOVER CYSTO (KITS) ×2 IMPLANT
L-HOOK LAP DISP 36CM (ELECTROSURGICAL) ×2 IMPLANT
LHOOK LAP DISP 36CM (ELECTROSURGICAL) ×2 IMPLANT
LIGASURE VESSEL 5MM BLUNT TIP (ELECTROSURGICAL) ×2 IMPLANT
MANIPULATOR VCARE LG CRV RETR (MISCELLANEOUS) IMPLANT
MANIPULATOR VCARE SML CRV RETR (MISCELLANEOUS) IMPLANT
MANIPULATOR VCARE STD CRV RETR (MISCELLANEOUS) IMPLANT
NS IRRIG 1000ML POUR BTL (IV SOLUTION) ×2 IMPLANT
OCCLUDER COLPOPNEUMO (BALLOONS) IMPLANT
PACK LAPAROSCOPY BASIN (CUSTOM PROCEDURE TRAY) ×2 IMPLANT
PAD PREP 24X48 CUFFED NSTRL (MISCELLANEOUS) ×2 IMPLANT
PENCIL BUTTON HOLSTER BLD 10FT (ELECTRODE) ×2 IMPLANT
POUCH LAPAROSCOPIC INSTRUMENT (MISCELLANEOUS) ×2 IMPLANT
PROTECTOR NERVE ULNAR (MISCELLANEOUS) IMPLANT
SCISSORS LAP 5X35 DISP (ENDOMECHANICALS) IMPLANT
SET CYSTO W/LG BORE CLAMP LF (SET/KITS/TRAYS/PACK) IMPLANT
SET TUBE SMOKE EVAC HIGH FLOW (TUBING) ×2 IMPLANT
SLEEVE ADV FIXATION 5X100MM (TROCAR) ×4 IMPLANT
SLEEVE Z-THREAD 5X100MM (TROCAR) IMPLANT
STRIP CLOSURE SKIN 1/2X4 (GAUZE/BANDAGES/DRESSINGS) ×2 IMPLANT
SURGIFLO W/THROMBIN 8M KIT (HEMOSTASIS) IMPLANT
SUT VICRYL 4-0 PS2 18IN ABS (SUTURE) ×2 IMPLANT
SUT VLOC 180 2-0 9IN GS21 (SUTURE) ×2 IMPLANT
SYR 10ML LL (SYRINGE) ×2 IMPLANT
SYR 50ML LL SCALE MARK (SYRINGE) ×2 IMPLANT
SYS RETRIEVAL 5MM INZII UNIV (BASKET) ×2 IMPLANT
SYSTEM RETRIEVL 5MM INZII UNIV (BASKET) IMPLANT
TOWEL OR 17X24 6PK STRL BLUE (TOWEL DISPOSABLE) ×4 IMPLANT
TRAY FOLEY W/BAG SLVR 14FR (SET/KITS/TRAYS/PACK) ×2 IMPLANT
TROCAR ADV FIXATION 5X100MM (TROCAR) ×2 IMPLANT
TROCAR Z-THREAD FIOS 5X100MM (TROCAR) ×2 IMPLANT
WATER STERILE IRR 500ML POUR (IV SOLUTION) IMPLANT

## 2023-06-29 NOTE — Progress Notes (Signed)
06/29/2023 12:59 PM Pt. And spouse voicing concerns that they were told today's procedure would include an overnight stay. Dr. Imogene Burn updated, verbal order given ok for patient to stay overnight. Pt. Updated and satisfied with plan of care.  Alicia Ross, Blanchard Kelch

## 2023-06-29 NOTE — Transfer of Care (Signed)
Immediate Anesthesia Transfer of Care Note  Patient: Alicia Ross  Procedure(s) Performed: TOTAL LAPAROSCOPIC HYSTERECTOMY WITH BILATERAL SALPINGO OOPHORECTOMY (Bilateral: Pelvis) LAPAROSCOPIC LYSIS OF ADHESIONS (Pelvis)  Patient Location: PACU  Anesthesia Type:General  Level of Consciousness: awake, alert , oriented, and patient cooperative  Airway & Oxygen Therapy: Patient Spontanous Breathing and Patient connected to nasal cannula oxygen  Post-op Assessment: Report given to RN and Post -op Vital signs reviewed and stable  Post vital signs: Reviewed and stable  Last Vitals:  Vitals Value Taken Time  BP 136/91 06/29/23 1001  Temp 36.3 C 06/29/23 1000  Pulse 71 06/29/23 1009  Resp 19 06/29/23 1009  SpO2 100 % 06/29/23 1009  Vitals shown include unfiled device data.  Last Pain:  Vitals:   06/29/23 1000  TempSrc:   PainSc: Asleep         Complications: No notable events documented.

## 2023-06-29 NOTE — Progress Notes (Signed)
Postop Progress Note  S: Feeling well, pain overall well-controlled. Has been up to the restroom. Tolerating regular diet. No bleeding.  O:      06/29/2023   12:45 PM 06/29/2023   12:21 PM 06/29/2023   10:11 AM  Vitals with BMI  Systolic 117 130 161  Diastolic 67 85 90  Pulse 79 88    Gen: NAD Cv: reg rate Pulm: NWOB Abd: soft, nondistended, appropriately ttp, incisions covered with dressings - no strike through Ext: no edema b/l GYN: No blood on peri pad  A/P: POD0 s/p TLH, BSO Doing well, meeting all postoperative milestones Reviewed surgical findings with her, all questions answered Patient strongly desires to stay overnight. Will evaluate tomorrow AM - likely for discharge then.  Jule Economy, MD

## 2023-06-29 NOTE — Anesthesia Procedure Notes (Signed)
Procedure Name: Intubation Date/Time: 06/29/2023 7:36 AM  Performed by: Bishop Limbo, CRNAPre-anesthesia Checklist: Patient identified, Emergency Drugs available, Suction available and Patient being monitored Patient Re-evaluated:Patient Re-evaluated prior to induction Oxygen Delivery Method: Circle System Utilized Preoxygenation: Pre-oxygenation with 100% oxygen Induction Type: IV induction Ventilation: Mask ventilation without difficulty Laryngoscope Size: Mac and 3 Grade View: Grade I Tube type: Oral Tube size: 7.0 mm Number of attempts: 1 Airway Equipment and Method: Stylet Placement Confirmation: ETT inserted through vocal cords under direct vision, positive ETCO2 and breath sounds checked- equal and bilateral Secured at: 21 cm Tube secured with: Tape Dental Injury: Teeth and Oropharynx as per pre-operative assessment

## 2023-06-29 NOTE — Anesthesia Postprocedure Evaluation (Signed)
Anesthesia Post Note  Patient: Sotiria Berteau  Procedure(s) Performed: TOTAL LAPAROSCOPIC HYSTERECTOMY WITH BILATERAL SALPINGO OOPHORECTOMY (Bilateral: Pelvis) LAPAROSCOPIC LYSIS OF ADHESIONS (Pelvis)     Patient location during evaluation: PACU Anesthesia Type: General Level of consciousness: awake and alert Pain management: pain level controlled Vital Signs Assessment: post-procedure vital signs reviewed and stable Respiratory status: spontaneous breathing, nonlabored ventilation and respiratory function stable Cardiovascular status: blood pressure returned to baseline Postop Assessment: no apparent nausea or vomiting Anesthetic complications: no   No notable events documented.  Last Vitals:  Vitals:   06/29/23 1100 06/29/23 1145  BP:    Pulse:    Resp:    Temp: (!) 36.4 C   SpO2: 96% 90%    Last Pain:  Vitals:   06/29/23 1200  TempSrc:   PainSc: Asleep                 Shanda Howells

## 2023-06-29 NOTE — Progress Notes (Signed)
During the transfer to Phase II PACU, ambulated client to restroom, pt c/o feeling weak and dizzy intermittently, client requested to lay back down on stretcher. Client stated she did not feel like she could go home today. Spoke with Primary MD, plan was to return back to Phase I PACU, placed back on cont cardiac monitoring, VSS, neuro wise, client is alert and oriented x 3, gait slightly unsteady. Dr Imogene Burn stated she would place additional orders for client to have observation in RCC. Will cont to monitor and observe client until tx to RCC occurs. Report provided to [facility] RNs

## 2023-06-29 NOTE — Op Note (Signed)
PREOPERATIVE DIAGNOSES: 1. Fibroid uterus 2. Menorrhagia 3. Pelvic pain  POSTOPERATIVE DIAGNOSES: Same  PROCEDURE PERFORMED: Total laparoscopic hysterectomy, bilateral salpingectomy  SURGEON: Jule Economy  ASSISTANT: Dr. Zelphia Cairo  ANESTHESIA: General  ESTIMATED BLOOD LOSS: 25ccs  COMPLICATIONS: None  TUBES: None.  DRAINS: None  PATHOLOGY: Uterus, bilateral fallopian tubes, bilateral ovaries, and filshie clips to pathology  FINDINGS: On exam, under anesthesia, normal appearing vulva, vagina, and cervix, 10 week sized uterus. Both filshie clips were adherent to peritoneum superior to bladder. Omental adhesion to umbilical mesh.  The patient was consented and seen preoperatively. Consent was signed and witnessed prior to procedure start with all questions answered. Antibiotics were given as appropriate indicated. She was taken to the operating room, and placed under general anesthesia with pneumatic compression stockings in place on her lower extremities. The patient was then placed in a dorsal lithotomy position with Allen-type stirrups with knees bent at 30 degree angles. The cervix and vagina were grossly normal with no obvious masses or deformities. The patient was prepped and draped in the normal sterile fashion. A time-out was performed with everyone in agreement.  A Foley catheter was inserted with yellow urine found to be draining appropriately. Attention was turned to the vagina. A V-care uterine manipulator was placed in the vagina and around the cervix without difficulty. Attention was then turned to the abdomen.  The skin in and around the umbilicus was injected with plain Bupivacaine. A 5mm skin incision was made at Palmer's point. The 5mm optiview trocar was introduced without difficulty in the abdomen. The obturator was removed. The CO2 was attached and pneumoperitoneum was initiated at high flow. The 5mm scope was then inserted to the port for inspection of the  abdomen with findings noted above.  The area underneath the mesh at the umbilicus as above - omental adhesions taken down with blunt dissection and the Ligasure. A 5mm port was under the mesh under direct visualization. Three additional 5mm ports were placed under direct visualization - two bilaterally 2 fingerbreadths medial to the anterior superior iliac spine and 2 fingerbreadths superior to the pubic symphysis bilaterally and one lateral to the umbilical port on the right side.   The patient was placed in Trendelenburg and the bowel was readily mobile allowing for better visualization of the pelvis. The uterus was noted to be mobile with adequate manipulation with use of the uterine manipulator.  The filshie clips were removed intact in an endocatch bag. With the uterus and adnexa retracted to the opposite side, the round ligament was placed on traction and divded using the LigaSure. The IP was identified to be far from the ureter was was transected. The anterior leaf of the broad ligament was transected down to the level of the vesicouterine peritoneum. The vesicouterine peritoneum was opened and the bladder was dissected off of the lower uterine segment and cervix. The posterior leaf of the broad ligament was divided. The uterine vessels were identified along the uterus and were sealed and divided. This procedure was repeated in an identical fashion on the contralateral side.  The Megadyne hook was used to create a circumferential incision along the cervicovaginal junction. The uterosacral and cardinal ligament complexes and vagina were completely detached from the cervix.  The uterus, cervix, bilateral fallopian tubes and ovaries were removed through the vagina, along with the uterine manipulator. The vaginal cuff was closed with V-loc suture laparoscopically without difficulty. Edges of the vaginal cuff were noted to be hemostatic. Arista was applied to  the cuff.  The abdomen and pelvis were  thoroughly inspected. Good hemostasis was appreciated throughout. All instruments were removed from the abdomen. The pneumoperitoneum was released and correct instrument counts were confirmed.  Skin incisions were closed with 4-0 Monocryl. A digital exam was performed and the cuff was noted to be intact vaginally. Foley catheter was removed. The patient tolerated the procedure well. She was taken to the recovery room in stable condition. The patient will be discharged from the PACU after all criteria are met.   Jule Economy, MD

## 2023-06-29 NOTE — Interval H&P Note (Signed)
History and Physical Interval Note:  06/29/2023 7:05 AM  Alicia Ross  has presented today for surgery, with the diagnosis of FIBROIDS  MENORRHAGIA.  The various methods of treatment have been discussed with the patient and family. After consideration of risks, benefits and other options for treatment, the patient has consented to  Procedure(s): TOTAL LAPAROSCOPIC HYSTERECTOMY WITH BILATERAL SALPINGO OOPHORECTOMY (Bilateral) as a surgical intervention.  The patient's history has been reviewed, patient examined, no change in status, stable for surgery.  I have reviewed the patient's chart and labs.  Questions were answered to the patient's satisfaction.    [name]

## 2023-06-29 NOTE — Progress Notes (Signed)
Pt up to BR frequently.  C/o not feeling like she's emptying her bladder.  Bladder scan done x 2- 0cc each time

## 2023-06-30 ENCOUNTER — Encounter (HOSPITAL_BASED_OUTPATIENT_CLINIC_OR_DEPARTMENT_OTHER): Payer: Self-pay | Admitting: Obstetrics and Gynecology

## 2023-06-30 DIAGNOSIS — N920 Excessive and frequent menstruation with regular cycle: Secondary | ICD-10-CM | POA: Diagnosis not present

## 2023-06-30 LAB — SURGICAL PATHOLOGY

## 2023-06-30 MED ORDER — ACETAMINOPHEN 500 MG PO TABS
ORAL_TABLET | ORAL | Status: AC
  Filled 2023-06-30: qty 2

## 2023-06-30 MED ORDER — OXYCODONE HCL 5 MG PO TABS
ORAL_TABLET | ORAL | Status: AC
  Filled 2023-06-30: qty 1

## 2023-06-30 MED ORDER — KETOROLAC TROMETHAMINE 30 MG/ML IJ SOLN
INTRAMUSCULAR | Status: AC
  Filled 2023-06-30: qty 1

## 2023-06-30 NOTE — Progress Notes (Addendum)
Postop Progress Note  S: Having some mild low back pain. Otherwise doing well. Tolerating regular diet. Has been ambulating without issue. Voiding spontaneously.   O:      06/30/2023    5:45 AM 06/30/2023    2:00 AM 06/29/2023    9:45 PM  Vitals with BMI  Systolic 124 118 416  Diastolic 74 70 79  Pulse 65 62 92   Gen: NAD Cv: reg rate Pulm: NWOB Abd: soft, nondistended, appropriately ttp, dressings over incisions without strike through Ext: no edema b/l GYN: No bleeding on peripad  A/P: POD1 s/p TLH, BSO Meeting all milestones. Recovery instructions provided in detail.  She is asking again re estradiol patch - we reviewed that normally I send that in at her 2 week postoperative visit, however she has had family members with issues with "hormonal imbalance" in the past. We discussed VTE risk - she strongly desires to start ASAP. Estradiol patches sent in for her yesterday, we reviewed earliest starting POD3. Discharge home today, plan for postoperative f/u in the office at 2 weeks and 6 weeks.  Jule Economy, MD

## 2023-07-06 ENCOUNTER — Ambulatory Visit: Payer: 59 | Admitting: Family Medicine

## 2023-07-06 VITALS — BP 127/88 | HR 79 | Temp 97.7°F | Ht 65.0 in | Wt 197.0 lb

## 2023-07-06 DIAGNOSIS — J019 Acute sinusitis, unspecified: Secondary | ICD-10-CM

## 2023-07-06 MED ORDER — AMOXICILLIN 500 MG PO CAPS: 500.0000 mg | ORAL_CAPSULE | Freq: Three times a day (TID) | ORAL | 0 refills | Status: DC

## 2023-07-06 NOTE — Progress Notes (Signed)
   Subjective:    Patient ID: Alicia Ross, female    DOB: 1976-04-11, 47 y.o.   MRN: [id number]  HPI  Patient presents today with respiratory illness Number of days present-3 to 4 weeks  Symptoms include-head congestion drainage coughing sinus pressure especially on the left side with pain discomfort to palpation  Presence of worrisome signs (severe shortness of breath, lethargy, etc.) -no worrisome symptoms  Recent/current visit to urgent care or ER-no ER or urgent care  Recent direct exposure to Covid-none  Any current Covid testing-none  Patient started off 3 to 4 weeks ago with head congestion drainage and little bit of coughing since then has had persistent coughing sometimes bringing up clear phlegm sometimes yellowish phlegm denies high fever chills denies wheezing has been having moderately frequent headaches    Review of Systems     Objective:   Physical Exam Gen-NAD not toxic TMS-normal bilateral T- normal no redness Chest-CTA respiratory rate normal no crackles CV RRR no murmur Skin-warm dry Neuro-grossly normal Mild to moderate tenderness in the left maxillary       Assessment & Plan:  Although this could well be viral with secondary congestion given that it has been going on for several weeks along with sinus tenderness it is reasonable to go ahead with antibiotics shared discussion with the patient regarding this and she agrees that antibiotics would be reasonable to utilize and she would like to do so Therefore we will go with amoxicillin 503 times daily for 10 days follow-up if progressive troubles  As for her hysterectomy she feels she is getting over this fairly well her bowels are moving she has a follow-up with her surgeon coming up in the near future and using estrogen patches 2 times per week she will follow-up with surgeon if any problems arise

## 2023-07-09 ENCOUNTER — Ambulatory Visit: Payer: Self-pay | Admitting: Family Medicine

## 2023-07-09 ENCOUNTER — Telehealth: Payer: Self-pay

## 2023-07-09 NOTE — Telephone Encounter (Signed)
Copied from CRM [id number]. Topic: Clinical - Medical Advice >> Jul 09, 2023  1:07 PM Nila Nephew wrote: Reason for CRM: Was given antibiotics for sinus infection but is not getting better. Is getting a new round of antibiotics now for a post-op incision but is seeking medical advice.  Chief Complaint:   Sinus Infection  Follow up infection diagnosed 07/06/23-(Scheduled appointment for  today 07/09/23 1630 pm.Patient was prescribed antibiotic  for Sinus infection  on 07/06/23. Advised by provider to call back if she is not getting any better. Patient states she feels worse. Increased sinus headache, increased sinus pressure. Also, Patient also had a  recent Hysterectomy on 06/29/23. Patient states she has an infection in her incision. Her provider is calling her in a canthitic but  she do est not  know the name at this time. She did share with her PCP she is on antibiotic and they told her they could  be find together. Patient  does not want to wait until her completion of 10 days of  antibiotic to say anything. amoxicillin (AMOXIL) 500 MG capsule 500 mg, 3 times daily         Summary: Take 1 capsule (500 mg total) by mouth 3 (three) times daily for 10 days., Starting Mon 07/06/2023, Until Thu 07/16/2023     Symptoms: Head Pressure  and Sinus Headache. Frequency: Constant Pertinent Negatives: Patient denies Fever. Disposition: [] ED /[] Urgent Care (no appt availability in office) / [x] Appointment(In office/virtual)/ []  [facility]/ [] Home Care/ [] Refused Recommended Disposition /[] [facility]/ []  Follow-up with PCP Additional Notes:  Post Op 06/29/23- Will get a new prescription today from GYN. Post op  for gyn surgery (hysterectomy for 06/29/2023) is  Reason for Disposition  [1] Taking antibiotic > 72 hours (3 days) AND [2] sinus pain not improved  Answer Assessment - Initial Assessment Questions 1. ANTIBIOTIC: "What antibiotic are you taking?" "How many times a day?"       Amoxicillin  Started on 07/06/2023  16 th of December had an a hysterectomy  is getting infection- painful, hot to touch and swollen 2. ONSET: "When was the antibiotic started?"     Amoxicillin   -   06/29/23 Hysterectomy ( will start an antibiotics today 3. PAIN: "How bad is the sinus pain?"   (Scale 1-10; mild, moderate or severe)    Patient rates her pain  is a 3-4 Moderates 4. FEVER: "Do you have a fever?" If Yes, ask: "What is it, how was it measured, and when did it start?"       Denies. 5. SYMPTOMS: "Are there any other symptoms you're concerned about?" If Yes, ask: "When did it start?"      Surgery on 06/29/23 I incision is infected - started on a new antibiotic today. Unsure of the name.  Today started to noted increase frequency  to restroom - urine is very light yellow, no other symptom. 6. PREGNANCY: "Is there any chance you are pregnant?" "When was your last menstrual period?"      Hysterectomy on f 06/29/23  Protocols used: Sinus Infection on Antibiotic Follow-up Call-[facility]

## 2023-07-09 NOTE — Telephone Encounter (Signed)
Reason for CRM: Patient is still having symptoms from her last visit. Wants to speak to nurse to see if she should be seen again. Please call back on primary number.   Pt has appt on 07/10/2023 @4 :30pm to follow up with Dr Adriana Simas

## 2023-07-10 ENCOUNTER — Ambulatory Visit: Payer: 59 | Admitting: Family Medicine

## 2023-07-10 VITALS — BP 123/85 | HR 71 | Temp 98.1°F | Ht 65.0 in | Wt 199.2 lb

## 2023-07-10 DIAGNOSIS — J019 Acute sinusitis, unspecified: Secondary | ICD-10-CM | POA: Diagnosis not present

## 2023-07-10 MED ORDER — DOXYCYCLINE HYCLATE 100 MG PO TABS: 100.0000 mg | ORAL_TABLET | Freq: Two times a day (BID) | ORAL | 0 refills | Status: DC

## 2023-07-10 MED ORDER — IPRATROPIUM BROMIDE 0.06 % NA SOLN: 2.0000 | Freq: Four times a day (QID) | NASAL | 0 refills | Status: DC | PRN

## 2023-07-10 NOTE — Progress Notes (Unsigned)
Subjective:  Patient ID: Alicia Ross, female    DOB: [birth year]  Age: 47 y.o. MRN: [id number]  CC:     HPI:  Patient Active Problem List   Diagnosis Date Noted   S/P total hysterectomy and bilateral salpingo-oophorectomy 06/29/2023   Arthritis of left knee 04/23/2023   Intermittent palpitations 04/23/2023   Obesity (BMI 30.0-34.9) 11/08/2022   Screening for colon cancer 11/08/2022   Genetic testing 05/16/2021   Family history of breast cancer 05/01/2021   Family history of pancreatic cancer 05/01/2021   Family history of melanoma 05/01/2021   Family history of uterine cancer 05/01/2021   Breast cancer screening, high risk patient 03/17/2021   Dense breasts 03/17/2021   Perimenopause 03/17/2021   Incisional hernia, without obstruction or gangrene 05/31/2020   Vitamin D deficiency 03/02/2020    Social Hx   Social History   Socioeconomic History   Marital status: Married    Spouse name: Not on file   Number of children: Not on file   Years of education: Not on file   Highest education level: Not on file  Occupational History   Not on file  Tobacco Use   Smoking status: Never   Smokeless tobacco: Never  Vaping Use   Vaping status: Never Used  Substance and Sexual Activity   Alcohol use: No   Drug use: Never   Sexual activity: Yes    Birth control/protection: Surgical  Other Topics Concern   Not on file  Social History Narrative   Not on file   Social Drivers of Health   Financial Resource Strain: Not on file  Food Insecurity: Not on file  Transportation Needs: Not on file  Physical Activity: Not on file  Stress: Not on file  Social Connections: Not on file    Review of Systems Per HPI  Objective:  BP 123/85   Pulse 71   Temp 98.1 F (36.7 C)   Ht 5\' 5"  (1.651 m)   Wt 199 lb 3.2 oz (90.4 kg)   SpO2 95%   BMI 33.15 kg/m      07/10/2023    3:51 PM 07/06/2023   11:39 AM 06/30/2023    8:20 AM  BP/Weight  Systolic BP 123 127 199  Diastolic  BP 85 88 75  Wt. (Lbs) 199.2 197   BMI 33.15 kg/m2 32.78 kg/m2     Physical Exam  Lab Results  Component Value Date   WBC 6.6 06/23/2023   HGB 13.4 06/23/2023   HCT 40.9 06/23/2023   PLT 173 06/23/2023   GLUCOSE 104 (H) 04/02/2023   CHOL 167 10/28/2022   TRIG 104 10/28/2022   HDL 47 10/28/2022   LDLCALC 101 (H) 10/28/2022   ALT 19 04/02/2023   AST 16 04/02/2023   NA 137 04/02/2023   K 3.8 04/02/2023   CL 105 04/02/2023   CREATININE 0.51 04/02/2023   BUN 18 04/02/2023   CO2 21 (L) 04/02/2023   TSH 2.250 10/28/2022     Assessment & Plan:   Problem List Items Addressed This Visit   None   Meds ordered this encounter  Medications   ipratropium (ATROVENT) 0.06 % nasal spray    Sig: Place 2 sprays into both nostrils 4 (four) times daily as needed for rhinitis.    Dispense:  15 mL    Refill:  0   doxycycline (VIBRA-TABS) 100 MG tablet    Sig: Take 1 tablet (100 mg total) by mouth 2 (two) times daily.  Dispense:  14 tablet    Refill:  0    Follow-up:  No follow-ups on file.  Everlene Other DO [facility]

## 2023-07-10 NOTE — Patient Instructions (Addendum)
Zyrtec or Claritin D.  Atrovent as prescribed.  Antibiotics as prescribed.  Take care  Dr. Adriana Simas

## 2023-07-12 DIAGNOSIS — J329 Chronic sinusitis, unspecified: Secondary | ICD-10-CM

## 2023-07-12 HISTORY — DX: Chronic sinusitis, unspecified: J32.9

## 2023-07-12 NOTE — Assessment & Plan Note (Signed)
Rx sent for Doxycycline and Atrovent.

## 2023-07-17 ENCOUNTER — Other Ambulatory Visit: Payer: Self-pay | Admitting: Family Medicine

## 2023-07-23 NOTE — Progress Notes (Deleted)
  Cardiology Office Note:  .   Date:  07/23/2023  ID:  Alicia Ross, DOB 1976-05-26, MRN [id number] PCP: Cook, Jayce G, DO  [facility] Providers Cardiologist:  None { Click to update primary MD,subspecialty MD or APP then REFRESH:1}   History of Present Illness: .   Alicia Ross is a 48 y.o. female with no significant past medical history he is being referred to cardiology for intermittent palpitations.  She also reported numbness in both of her arms after lying on her back.  ROS: ***  Studies Reviewed: .        *** Risk Assessment/Calculations:   {Does this patient have ATRIAL FIBRILLATION?:[phone]} No BP recorded.  {Refresh Note OR Click here to enter BP  :1}***       Physical Exam:   VS:  There were no vitals taken for this visit.   Wt Readings from Last 3 Encounters:  07/10/23 199 lb 3.2 oz (90.4 kg)  07/06/23 197 lb (89.4 kg)  06/29/23 200 lb 11.2 oz (91 kg)    GEN: Well nourished, well developed in no acute distress NECK: No JVD; No carotid bruits CARDIAC: ***RRR, no murmurs, rubs, gallops RESPIRATORY:  Clear to auscultation without rales, wheezing or rhonchi  ABDOMEN: Soft, non-tender, non-distended EXTREMITIES:  No edema; No deformity   ASSESSMENT AND PLAN: .   Intermittent palpitations Recommend a 3-day Zio patch    {Are you ordering a CV Procedure (e.g. stress test, cath, DCCV, TEE, etc)?   Press F2        :[id number]}  Dispo: ***  Signed, Alvan Ronal BRAVO, MD

## 2023-07-24 ENCOUNTER — Ambulatory Visit: Payer: 59 | Admitting: Internal Medicine

## 2023-07-26 ENCOUNTER — Other Ambulatory Visit: Payer: Self-pay | Admitting: Family Medicine

## 2023-08-04 ENCOUNTER — Ambulatory Visit: Payer: 59 | Attending: Cardiology | Admitting: Cardiology

## 2023-08-04 ENCOUNTER — Ambulatory Visit: Payer: 59 | Attending: Cardiology

## 2023-08-04 ENCOUNTER — Encounter: Payer: Self-pay | Admitting: Cardiology

## 2023-08-04 VITALS — BP 115/83 | HR 72 | Ht 65.0 in | Wt 194.2 lb

## 2023-08-04 DIAGNOSIS — R202 Paresthesia of skin: Secondary | ICD-10-CM | POA: Diagnosis not present

## 2023-08-04 DIAGNOSIS — R002 Palpitations: Secondary | ICD-10-CM | POA: Diagnosis not present

## 2023-08-04 NOTE — Patient Instructions (Addendum)
Keep hydrated Avoid triggers for palp -- not enough sleep, reduce or eliminate sweets and caffeine , reducing stress    Medication Instructions:   No changes    Lab Work:  Not needed   Testing/Procedures: Will be mailed to you in 3 -7 days Your physician has recommended that you wear a holter monitor-- 7 days Zio . Holter monitors are medical devices that record the heart's electrical activity. Doctors most often use these monitors to diagnose arrhythmias. Arrhythmias are problems with the speed or rhythm of the heartbeat. The monitor is a small, portable device. You can wear one while you do your normal daily activities. This is usually used to diagnose what is causing palpitations/syncope (passing out).    Follow-Up: At [facility], you and your health needs are our priority.  As part of our continuing mission to provide you with exceptional heart care, we have created designated Provider Care Teams.  These Care Teams include your primary Cardiologist (physician) and Advanced Practice Providers (APPs -  Physician Assistants and Nurse Practitioners) who all work together to provide you with the care you need, when you need it.     Your next appointment:   2 month(s)  The format for your next appointment:   In Person  Provider:   Bryan Lemma, MD    Other Instructions   ZIO XT- Long Term Monitor Instructions  Your physician has requested you wear a ZIO patch monitor for 7 days.  This is a single patch monitor. [employer] supplies one patch monitor per enrollment. Additional stickers are not available. Please do not apply patch if you will be having a Nuclear Stress Test,  Echocardiogram, Cardiac CT, MRI, or Chest Xray during the period you would be wearing the  monitor. The patch cannot be worn during these tests. You cannot remove and re-apply the  ZIO XT patch monitor.  Your ZIO patch monitor will be mailed 3 day USPS to your address on file. It may take 3-5 days  to  receive your monitor after you have been enrolled.  Once you have received your monitor, please review the enclosed instructions. Your monitor  has already been registered assigning a specific monitor serial # to you.  Billing and Patient Assistance Program Information  We have supplied [employer] with any of your insurance information on file for billing purposes. [employer] offers a sliding scale Patient Assistance Program for patients that do not have  insurance, or whose insurance does not completely cover the cost of the ZIO monitor.  You must apply for the Patient Assistance Program to qualify for this discounted rate.  To apply, please call [employer] at [phone], select option 4, select option 2, ask to apply for  Patient Assistance Program. Meredeth Ide will ask your household income, and how many people  are in your household. They will quote your out-of-pocket cost based on that information.  [employer] will also be able to set up a 43-month, interest-free payment plan if needed.  Applying the monitor   Shave hair from upper left chest.  Hold abrader disc by orange tab. Rub abrader in 40 strokes over the upper left chest as  indicated in your monitor instructions.  Clean area with 4 enclosed alcohol pads. Let dry.  Apply patch as indicated in monitor instructions. Patch will be placed under collarbone on left  side of chest with arrow pointing upward.  Rub patch adhesive wings for 2 minutes. Remove white label marked "1". Remove the white  label marked "  2". Rub patch adhesive wings for 2 additional minutes.  While looking in a mirror, press and release button in center of patch. A small green light will  flash 3-4 times. This will be your only indicator that the monitor has been turned on.  Do not shower for the first 24 hours. You may shower after the first 24 hours.  Press the button if you feel a symptom. You will hear a small click. Record Date, Time and  Symptom in the Patient  Logbook.  When you are ready to remove the patch, follow instructions on the last 2 pages of Patient  Logbook. Stick patch monitor onto the last page of Patient Logbook.  Place Patient Logbook in the blue and white box. Use locking tab on box and tape box closed  securely. The blue and white box has prepaid postage on it. Please place it in the mailbox as  soon as possible. Your physician should have your test results approximately 7 days after the  monitor has been mailed back to [facility].  Call [facility] Customer Care at [phone] if you have questions regarding  your ZIO XT patch monitor. Call them immediately if you see an orange light blinking on your  monitor.  If your monitor falls off in less than 4 days, contact our Monitor department at [phone].  If your monitor becomes loose or falls off after 4 days call [employer] at [phone] for  suggestions on securing your monitor

## 2023-08-04 NOTE — Progress Notes (Unsigned)
Cardiology Office Note:  .   Date:  08/05/2023  ID:  [name], DOB 1975-11-19, MRN [id number] PCP: [name] DO  [facility] Providers Cardiologist:  Bryan Lemma, MD     Chief Complaint  Patient presents with   New Patient (Initial Visit)   Palpitations    Patient Profile: .     Alicia Ross is an obese 48 y.o. female non-smoker with a PMH notable for menorrhagia status post hysterectomy who presents here for evaluation of intermittent palpitations and leg numbness.  She presents here today at the request of [name], DO. She also reported numbness in both of her arms after lying on her back.   Alicia Ross was seen at [facility] on 07/10/2023 by Everlene Other, DO for a follow-up after treatment for URI.  She was getting over sinusitis as well as an incision infection from her hysterectomy amoxicillin then Keflex.  She was feeling better overall.  However there had been evaluations previously about palpitations and a referral placed for cardiology.  Subjective  Discussed the use of AI scribe software for clinical note transcription with the patient, who gave verbal consent to proceed.  History of Present Illness   The patient, recovering from a recent hysterectomy, presents with a chief complaint of vibrational palpitations and arm numbness. She reports experiencing irregular heartbeats, described as "flip flopping," lasting for seconds to minutes but not persisting for extended periods. These palpitations are occasionally severe enough to take the patient's breath away. The patient also reports concurrent tingling in her arms, as well as numbness and tingling in both legs. Despite these symptoms, she denies any chest pain, pressure, or shortness of breath.  The frequency of the palpitations has decreased during her recovery from the hysterectomy, a period characterized by reduced stress due to a leave of absence from work. However, the palpitations  continue to occur, mostly on a daily basis. The patient has identified potential triggers, such as sweets and caffeine, and makes an effort to avoid these while maintaining adequate hydration.  The patient's family history is significant for hypertension and hypercholesterolemia in her father, and coronary disease with a myocardial infarction in her late sixties in her paternal grandfather.   Social History - Patient is a non-smoker - Patient avoids triggers like sweets and caffeine - Patient tries to drink plenty of water daily - Patient is currently on leave from work and less stressed   Family History - Father has hypertension - Father has hypercholesterolemia - Paternal grandfather had coronary disease - Paternal grandfather had MI in her late 53s       Cardiovascular ROS: no chest pain or dyspnea on exertion positive for - irregular heartbeat, palpitations, and these may or may not take her breath away.  Can last seconds to minutes. negative for - edema, orthopnea, paroxysmal nocturnal dyspnea, rapid heart rate, shortness of breath, or lightheadedness or dizziness wooziness (since recovering from surgery), syncope or near syncope  Objective   S/p TAH/BSO December 2024  Social history: She is here for palpitations numbness.  She is non-smoker   Studies Reviewed: Marland Kitchen       N/A  Risk Assessment/Calculations:             Physical Exam:   VS:  BP 115/83   Pulse 72   Ht 5\' 5"  (1.651 m)   Wt 194 lb 3.2 oz (88.1 kg)   SpO2 97%   BMI 32.32 kg/m    Wt  Readings from Last 3 Encounters:  08/04/23 194 lb 3.2 oz (88.1 kg)  07/10/23 199 lb 3.2 oz (90.4 kg)  07/06/23 197 lb (89.4 kg)    GEN: Well nourished, well groomed in no acute distress; obese, but healthy-appearing.  Mild NECK: No JVD; No carotid bruits CARDIAC: Normal S1, S2; RRR, no murmurs, rubs, gallops RESPIRATORY:  Clear to auscultation without rales, wheezing or rhonchi ; nonlabored, good air movement. ABDOMEN:  Soft, non-tender, non-distended EXTREMITIES:  No edema; No deformity      ASSESSMENT AND PLAN: .    Problem List Items Addressed This Visit     Intermittent palpitations - Primary (Chronic)   Daily palpitations described as "flip flopping" lasting seconds to minutes. No associated chest pain or pressure. Some associated breathlessness. Symptoms have improved with reduced stress during recovery from recent hysterectomy. -7 Day Zio patch monitor and follow up once done & read      Relevant Orders   LONG TERM MONITOR (3-14 DAYS)   Paresthesias   Numbness and Tingling Bilateral arm and leg numbness and tingling, possibly related to palpitations. -Consider further neurological evaluation if symptoms persist or worsen.         Follow-Up: Return in about 2 months (around 10/02/2023) for Routine Follow-up after testing ~ 1-2 months.     Signed, Marykay Lex, MD, MS Bryan Lemma, M.D., M.S. Interventional Cardiologist  [facility]  Pager # [phone] Phone # [phone] [address] [place], [place] [address]

## 2023-08-04 NOTE — Progress Notes (Unsigned)
Enrolled patient for a 7 day Zio XT monitor to be mailed to patients home.  

## 2023-08-05 ENCOUNTER — Encounter: Payer: Self-pay | Admitting: Cardiology

## 2023-08-05 DIAGNOSIS — R202 Paresthesia of skin: Secondary | ICD-10-CM | POA: Insufficient documentation

## 2023-08-05 NOTE — Assessment & Plan Note (Signed)
Numbness and Tingling Bilateral arm and leg numbness and tingling, possibly related to palpitations. -Consider further neurological evaluation if symptoms persist or worsen.

## 2023-08-05 NOTE — Assessment & Plan Note (Signed)
Daily palpitations described as "flip flopping" lasting seconds to minutes. No associated chest pain or pressure. Some associated breathlessness. Symptoms have improved with reduced stress during recovery from recent hysterectomy. -7 Day Zio patch monitor and follow up once done & read

## 2023-08-07 DIAGNOSIS — R002 Palpitations: Secondary | ICD-10-CM

## 2023-08-28 ENCOUNTER — Encounter: Payer: Self-pay | Admitting: Nurse Practitioner

## 2023-08-28 ENCOUNTER — Other Ambulatory Visit: Payer: Self-pay | Admitting: Nurse Practitioner

## 2023-08-28 DIAGNOSIS — Z13220 Encounter for screening for lipoid disorders: Secondary | ICD-10-CM

## 2023-08-28 DIAGNOSIS — R202 Paresthesia of skin: Secondary | ICD-10-CM

## 2023-08-28 DIAGNOSIS — R002 Palpitations: Secondary | ICD-10-CM

## 2023-08-28 DIAGNOSIS — R5383 Other fatigue: Secondary | ICD-10-CM

## 2023-09-18 ENCOUNTER — Ambulatory Visit: Payer: 59 | Admitting: Internal Medicine

## 2023-09-25 ENCOUNTER — Encounter: Payer: Self-pay | Admitting: Cardiology

## 2023-09-26 ENCOUNTER — Encounter: Payer: Self-pay | Admitting: Nurse Practitioner

## 2023-09-26 LAB — CBC WITH DIFFERENTIAL/PLATELET
Basophils Absolute: 0 x10E3/uL (ref 0.0–0.2)
Basos: 0 %
EOS (ABSOLUTE): 0.1 x10E3/uL (ref 0.0–0.4)
Eos: 1 %
Hematocrit: 45.2 % (ref 34.0–46.6)
Hemoglobin: 15.4 g/dL (ref 11.1–15.9)
Immature Grans (Abs): 0 x10E3/uL (ref 0.0–0.1)
Immature Granulocytes: 0 %
Lymphocytes Absolute: 2.3 x10E3/uL (ref 0.7–3.1)
Lymphs: 39 %
MCH: 32.4 pg (ref 26.6–33.0)
MCHC: 34.1 g/dL (ref 31.5–35.7)
MCV: 95 fL (ref 79–97)
Monocytes Absolute: 0.5 x10E3/uL (ref 0.1–0.9)
Monocytes: 8 %
Neutrophils Absolute: 3.1 x10E3/uL (ref 1.4–7.0)
Neutrophils: 52 %
Platelets: 205 x10E3/uL (ref 150–450)
RBC: 4.75 x10E6/uL (ref 3.77–5.28)
RDW: 11.9 % (ref 11.7–15.4)
WBC: 5.9 x10E3/uL (ref 3.4–10.8)

## 2023-09-26 LAB — LIPID PANEL
Chol/HDL Ratio: 4.3 ratio (ref 0.0–4.4)
Cholesterol, Total: 207 mg/dL — ABNORMAL HIGH (ref 100–199)
HDL: 48 mg/dL
LDL Chol Calc (NIH): 138 mg/dL — ABNORMAL HIGH (ref 0–99)
Triglycerides: 118 mg/dL (ref 0–149)
VLDL Cholesterol Cal: 21 mg/dL (ref 5–40)

## 2023-09-26 LAB — COMPREHENSIVE METABOLIC PANEL WITH GFR
ALT: 18 IU/L (ref 0–32)
AST: 19 IU/L (ref 0–40)
Albumin: 4.9 g/dL (ref 3.9–4.9)
Alkaline Phosphatase: 75 IU/L (ref 44–121)
BUN/Creatinine Ratio: 24 — ABNORMAL HIGH (ref 9–23)
BUN: 15 mg/dL (ref 6–24)
Bilirubin Total: 0.6 mg/dL (ref 0.0–1.2)
CO2: 21 mmol/L (ref 20–29)
Calcium: 9.5 mg/dL (ref 8.7–10.2)
Chloride: 102 mmol/L (ref 96–106)
Creatinine, Ser: 0.62 mg/dL (ref 0.57–1.00)
Globulin, Total: 2.3 g/dL (ref 1.5–4.5)
Glucose: 96 mg/dL (ref 70–99)
Potassium: 4.3 mmol/L (ref 3.5–5.2)
Sodium: 141 mmol/L (ref 134–144)
Total Protein: 7.2 g/dL (ref 6.0–8.5)
eGFR: 110 mL/min/1.73

## 2023-09-26 LAB — IRON,TIBC AND FERRITIN PANEL
Ferritin: 108 ng/mL (ref 15–150)
Iron Saturation: 44 % (ref 15–55)
Iron: 127 ug/dL (ref 27–159)
Total Iron Binding Capacity: 289 ug/dL (ref 250–450)
UIBC: 162 ug/dL (ref 131–425)

## 2023-09-26 LAB — TSH: TSH: 2.82 u[IU]/mL (ref 0.450–4.500)

## 2023-09-29 NOTE — Telephone Encounter (Signed)
 Reading it now.  Will be in the results section.

## 2023-10-02 ENCOUNTER — Encounter: Payer: Self-pay | Admitting: Cardiology

## 2023-10-08 ENCOUNTER — Telehealth: Payer: Self-pay | Admitting: *Deleted

## 2023-10-08 NOTE — Telephone Encounter (Signed)
 Reviewed results and recommendations with patient. She reports that she did not have as many episodes. She states that since then she started to have them more frequently again. She did inquire about wearing a repeat monitor if her symptoms persist or worsen. We discussed use of caffeine, stress, anxiety, panic attacks, etc that can contribute to these symptoms. She denied any of those. We reviewed results again and answered all of her questions. She did report having a repeat appointment that was scheduled and she wanted to know if she should keep it. Advised that I would send this information over to provider for his review and any further recommendations and to see if she needs to keep that appointment. She verbalized understanding of our conversation with no further questions at this time.

## 2023-10-08 NOTE — Telephone Encounter (Signed)
-----   Message from Bryan Lemma sent at 10/02/2023  4:47 PM EDT -----  ~10-day Zio patch monitor (January-February 2025)   Predominant rhythm is sinus rhythm with a rate range of 47 to 134 bpm.  Average 74 bpm.   There are rare isolated PACs (premature atrial contractions) and PVCs (premature ventricular contractions) with rare couplets and brief episodes of ventricular bigeminy/trigeminy.--Benign findings, but symptoms were noted on patient diary.   1 run up 4 PVCs (lasted less than 2 seconds) with a peak rate 135 bpm but average of 104 bpm.  Asymptomatic.   1 atrial run of 4 PACs (1.4 seconds) with heart rate range of 121 to 133 bpm (average 126 bpm.  Lasted   No Sustained Arrhythmias: Atrial Tachycardia (AT), Supraventricular Tachycardia (SVT), Atrial Fibrillation (A-Fib), Atrial Flutter (A-Flutter), Sustained Ventricular Tachycardia (VT)   Overall, pretty benign study.  Symptomatic PACs, PVCs with some bigeminy and trigeminy.  All of these are benign findings.  Would not recommend further treatment or further evaluation.   Bryan Lemma, MD

## 2023-10-08 NOTE — Telephone Encounter (Signed)
 Secure chat from Dr. Herbie Baltimore in regards to patient concerns and appt scheduled.   I am fine with her coming back - can discuss options or testing /meds or reassurance. DH   Patient verbalized understanding with no further questions at this time.

## 2023-10-11 NOTE — Progress Notes (Unsigned)
 Cardiology Office Note:  .   Date:  10/12/2023  ID:  Alicia Ross, DOB 08/11/1975, MRN [id number] PCP: [name], DO  [facility] Providers Cardiologist:  Bryan Lemma, MD     Chief Complaint  Patient presents with   Follow-up   Palpitations    Zio Results - palpitations more frequent    Patient Profile: .     Alicia Ross is an obese 48 y.o. female non-smoker with a PMH notable for menorrhagia (s/p hysterectomy) who presents here for follow-up evaluation of palpitations and arm/leg numbness at the request of [name], DO.    Alicia Ross was seen on August 04, 2023 for evaluation of intermittent palpitations described as a "flip-flopping sensation lasting anywhere from seconds to minutes.  Not associate with chest pain some dyspnea with the tachycardia.  Zio patch ordered.  She now presents for review of results. -> Apparently she has noted that the episodes of reduced initially but then became more frequent.  Subjective  Discussed the use of AI scribe software for clinical note transcription with the patient, who gave verbal consent to proceed.  History of Present Illness   Alicia Ross is a 48 year old female who presents with increased palpitations following a hysterectomy.  She has experienced an increase in palpitations since her recent hysterectomy. The palpitations are rapid and occur more frequently, lasting anywhere from 20 seconds to 2-3 minutes. She believes the increase in palpitations may be related to stress as she has returned to work after her surgery.  A cardiac monitor showed mostly normal rhythm with heart rates ranging from the high 40s during sleep to the 130s during activity. She experienced premature atrial contractions (PACs) and premature ventricular contractions (PVCs), with some episodes of PVCs occurring every other beat. She noted one short burst of three PVCs and another of four PACs, with the longest episodes lasting about 10-20  seconds.  She has been off work due to her surgery and has recently returned, which she believes may be contributing to her symptoms. She was informed that she might have been anemic post-surgery, which could have triggered the palpitations as her heart adjusted to the changes.  She acknowledges feeling the palpitations more when she is anxious or at the end of the day when trying to relax. Stress, anxiety, lack of sleep, caffeine, sweets, and chocolate can trigger these episodes.        Objective    Studies Reviewed: .         ~10-day Zio patch monitor (January-February 2025)    Predominant rhythm is sinus rhythm with a rate range of 47 to 134 bpm.  Average 74 bpm.   There are rare isolated PACs (premature atrial contractions) and PVCs (premature ventricular contractions) with rare couplets and brief episodes of ventricular bigeminy/trigeminy.--Benign findings, but symptoms were noted on patient diary.   1 run up 4 PVCs (lasted less than 2 seconds) with a peak rate 135 bpm but average of 104 bpm.  Asymptomatic.   1 atrial run of 4 PACs (1.4 seconds) with heart rate range of 121 to 133 bpm (average 126 bpm.  Lasted   No Sustained Arrhythmias: Atrial Tachycardia (AT), Supraventricular Tachycardia (SVT), Atrial Fibrillation (A-Fib), Atrial Flutter (A-Flutter), Sustained Ventricular Tachycardia (VT)   Overall, pretty benign study.  Symptomatic PACs, PVCs with some bigeminy and trigeminy.  All of these are benign findings.  Would not recommend further treatment or further evaluation.  Lab Results  Component Value Date  CHOL 207 (H) 09/25/2023   HDL 48 09/25/2023   LDLCALC 138 (H) 09/25/2023   TRIG 118 09/25/2023   CHOLHDL 4.3 09/25/2023   Lab Results  Component Value Date   NA 141 09/25/2023   K 4.3 09/25/2023   CREATININE 0.62 09/25/2023   EGFR 110 09/25/2023   GLUCOSE 96 09/25/2023     Risk Assessment/Calculations:               Physical Exam:   VS:  BP 118/84 (BP  Location: Right Arm, Patient Position: Sitting, Cuff Size: Normal)   Pulse (!) 58   Ht 5\' 5"  (1.651 m)   Wt 196 lb 12.8 oz (89.3 kg)   SpO2 98%   BMI 32.75 kg/m    Wt Readings from Last 3 Encounters:  10/12/23 196 lb 12.8 oz (89.3 kg)  08/04/23 194 lb 3.2 oz (88.1 kg)  07/10/23 199 lb 3.2 oz (90.4 kg)    GEN: Well nourished, well developed in no acute distress; healthy appearing RESPIRATORY:  nonlabored, good air movement     ASSESSMENT AND PLAN: .    Problem List Items Addressed This Visit     Intermittent palpitations - Primary (Chronic)   Increased palpitations post-hysterectomy, likely stress-related. Monitor showed normal rhythm with occasional PACs and PVCs, which are benign. No prolonged tachycardia observed. Palpitations exacerbated by stress, anxiety, lack of sleep, caffeine, and sweets. Resting heart rate is 58 bpm, and blood pressure is 120/90 mmHg, making medication management less favorable. Less than 1% of PACs and PVCs observed, indicating low risk. Vagal maneuvers and lifestyle modifications recommended. Informed consent provided regarding benign nature and low risk of complications. Potential for resolution as recovery progresses and stress decreases. - Recommend over-the-counter magnesium supplements. - Advise against daily beta blockers due to potential fatigue. - Suggest as-needed medication for severe episodes. - Educate on vagal maneuvers (vigorous coughing, bearing down, squatting) to break palpitations. - Advise drinking ice water during episodes to trigger a reflex that calms the heart. - Encourage coping mechanisms for stress management. - Consider KardiaMobile if palpitations persist after six months.        Follow-Up: Return if symptoms worsen or fail to improve, for Followup when necessary.  I spent 26 minutes in the care of Edwardine Deschepper today including reviewing studies ( ), face to face time discussing treatment options (15), reviewing records  from past notes & Hospitalization (3 mn), 5 min, and documenting in the encounter.     Signed, Marykay Lex, MD, MS Bryan Lemma, M.D., M.S. Interventional Cardiologist  [facility]  Pager # [phone] Phone # [phone] [address] [place], [place] [address]

## 2023-10-12 ENCOUNTER — Encounter: Payer: Self-pay | Admitting: Cardiology

## 2023-10-12 ENCOUNTER — Ambulatory Visit: Payer: 59 | Attending: Cardiology | Admitting: Cardiology

## 2023-10-12 VITALS — BP 118/84 | HR 58 | Ht 65.0 in | Wt 196.8 lb

## 2023-10-12 DIAGNOSIS — R002 Palpitations: Secondary | ICD-10-CM | POA: Diagnosis not present

## 2023-10-12 NOTE — Patient Instructions (Signed)
 Medication Instructions:   No changes    Lab Work: Not needed    Testing/Procedures:  Not needed  Follow-Up: At [facility], you and your health needs are our priority.  As part of our continuing mission to provide you with exceptional heart care, we have created designated Provider Care Teams.  These Care Teams include your primary Cardiologist (physician) and Advanced Practice Providers (APPs -  Physician Assistants and Nurse Practitioners) who all work together to provide you with the care you need, when you need it.     Your next appointment:   As needed  The format for your next appointment:   In Person  Provider:   Bryan Lemma, MD    Other Instructions  Keep hydrate  Recommendations for vagal maneuvers: "Bearing down" Coughing Gagging Icy, cold towel on face or drink ice cold water    Symptoms return  recommend purchase Kardia Mobile to capture the  rhythm  [email or web address] [email or web address]        FDA-cleared, clinical grade mobile EKG monitor: Lourena Simmonds is the most clinically-validated mobile EKG used by the world's leading cardiac care medical professionals With Basic service, know instantly if your heart rhythm is normal or if atrial fibrillation is detected, and email the last single EKG recording to yourself or your doctor Premium service, available for purchase through the Kardia app for $9.99 per month or $99 per year, includes unlimited history and storage of your EKG recordings, a monthly EKG summary report to share with your doctor, along with the ability to track your blood pressure, activity and weight Includes one KardiaMobile phone clip FREE SHIPPING: Standard delivery 1-3 business days. Orders placed by 11:00am PST will ship that afternoon. Otherwise, will ship next business day. All orders ship via [employer] from [place], [place]    [employer] - sending an EKG Download app and set up profile. Run EKG - by  placing 1-2 fingers on the silver plates After EKG is complete - Download PDF  - Skip password (if you apply a password the provider will need it to view the EKG) Click share button (square with upward arrow) in bottom left corner To send: choose MyChart (first time log into MyChart)  Pop up window about sending ECG Click continue Choose type of message Choose provider Type subject and message Click send (EKG should be attached)  - To send additional EKGs in one message click the paperclip image and bottom of page to attach.

## 2023-10-12 NOTE — Assessment & Plan Note (Signed)
 Increased palpitations post-hysterectomy, likely stress-related. Monitor showed normal rhythm with occasional PACs and PVCs, which are benign. No prolonged tachycardia observed. Palpitations exacerbated by stress, anxiety, lack of sleep, caffeine, and sweets. Resting heart rate is 58 bpm, and blood pressure is 120/90 mmHg, making medication management less favorable. Less than 1% of PACs and PVCs observed, indicating low risk. Vagal maneuvers and lifestyle modifications recommended. Informed consent provided regarding benign nature and low risk of complications. Potential for resolution as recovery progresses and stress decreases. - Recommend over-the-counter magnesium supplements. - Advise against daily beta blockers due to potential fatigue. - Suggest as-needed medication for severe episodes. - Educate on vagal maneuvers (vigorous coughing, bearing down, squatting) to break palpitations. - Advise drinking ice water during episodes to trigger a reflex that calms the heart. - Encourage coping mechanisms for stress management. - Consider KardiaMobile if palpitations persist after six months.

## 2023-11-03 ENCOUNTER — Ambulatory Visit: Admitting: Family Medicine

## 2023-11-03 VITALS — BP 120/79 | HR 72 | Temp 98.5°F | Ht 65.0 in | Wt 195.0 lb

## 2023-11-03 DIAGNOSIS — J019 Acute sinusitis, unspecified: Secondary | ICD-10-CM | POA: Insufficient documentation

## 2023-11-03 DIAGNOSIS — J0110 Acute frontal sinusitis, unspecified: Secondary | ICD-10-CM

## 2023-11-03 MED ORDER — AMOXICILLIN-POT CLAVULANATE 875-125 MG PO TABS: 1.0000 | ORAL_TABLET | Freq: Two times a day (BID) | ORAL | 0 refills | Status: DC

## 2023-11-03 NOTE — Progress Notes (Signed)
 Subjective:  Patient ID: Alicia Ross, female    DOB: [birth year]  Age: 48 y.o. MRN: [id number]  CC:   Chief Complaint  Patient presents with   sinus pressure and congestion    Also coughing and blowing yellow phlegm , scratchy throat using claritin d  Over 2 weeks    HPI:  48 year old female presents with respiratory symptoms.  She has had symptoms for the past 2 weeks.  For scratchy throat, occasional productive cough, severe sinus pain and pressure particularly in the frontal region.  She has been using Claritin-D without resolution.  No fever.  Patient Active Problem List   Diagnosis Date Noted   Acute sinusitis 11/03/2023   Paresthesias 08/05/2023   S/P total hysterectomy and bilateral salpingo-oophorectomy 06/29/2023   Arthritis of left knee 04/23/2023   Intermittent palpitations 04/23/2023   Obesity (BMI 30.0-34.9) 11/08/2022   Genetic testing 05/16/2021   Family history of breast cancer 05/01/2021   Family history of pancreatic cancer 05/01/2021   Family history of melanoma 05/01/2021   Family history of uterine cancer 05/01/2021   Breast cancer screening, high risk patient 03/17/2021   Vitamin D  deficiency 03/02/2020    Social Hx   Social History   Socioeconomic History   Marital status: Married    Spouse name: Not on file   Number of children: Not on file   Years of education: Not on file   Highest education level: Not on file  Occupational History   Not on file  Tobacco Use   Smoking status: Never   Smokeless tobacco: Never  Vaping Use   Vaping status: Never Used  Substance and Sexual Activity   Alcohol use: No   Drug use: Never   Sexual activity: Yes    Birth control/protection: Surgical  Other Topics Concern   Not on file  Social History Narrative   Not on file   Social Drivers of Health   Financial Resource Strain: Not on file  Food Insecurity: Not on file  Transportation Needs: Not on file  Physical Activity: Not on file  Stress: Not  on file  Social Connections: Not on file    Review of Systems Per HPI  Objective:  BP 120/79   Pulse 72   Temp 98.5 F (36.9 C)   Ht 5\' 5"  (1.651 m)   Wt 195 lb (88.5 kg)   SpO2 97%   BMI 32.45 kg/m      11/03/2023    1:59 PM 10/12/2023    8:03 AM 08/04/2023    9:03 AM  BP/Weight  Systolic BP 120 118 115  Diastolic BP 79 84 83  Wt. (Lbs) 195 196.8 194.2  BMI 32.45 kg/m2 32.75 kg/m2 32.32 kg/m2    Physical Exam Constitutional:      General: She is not in acute distress.    Appearance: Normal appearance.  HENT:     Head: Normocephalic and atraumatic.     Right Ear: Tympanic membrane normal.     Left Ear: Tympanic membrane normal.     Nose:     Right Sinus: Maxillary sinus tenderness and frontal sinus tenderness present.     Left Sinus: Maxillary sinus tenderness and frontal sinus tenderness present.     Mouth/Throat:     Pharynx: Oropharynx is clear.  Cardiovascular:     Rate and Rhythm: Normal rate.  Neurological:     Mental Status: She is alert.     Lab Results  Component Value Date   WBC 5.9  09/25/2023   HGB 15.4 09/25/2023   HCT 45.2 09/25/2023   PLT 205 09/25/2023   GLUCOSE 96 09/25/2023   CHOL 207 (H) 09/25/2023   TRIG 118 09/25/2023   HDL 48 09/25/2023   LDLCALC 138 (H) 09/25/2023   ALT 18 09/25/2023   AST 19 09/25/2023   NA 141 09/25/2023   K 4.3 09/25/2023   CL 102 09/25/2023   CREATININE 0.62 09/25/2023   BUN 15 09/25/2023   CO2 21 09/25/2023   TSH 2.820 09/25/2023     Assessment & Plan:  Acute frontal sinusitis, recurrence not specified Assessment & Plan: Treating with Augmentin .   Other orders -     Amoxicillin -Pot Clavulanate; Take 1 tablet by mouth 2 (two) times daily.  Dispense: 14 tablet; Refill: 0    Follow-up:  Return if symptoms worsen or fail to improve.  Kathleen Papa DO [facility]

## 2023-11-03 NOTE — Assessment & Plan Note (Signed)
 Treating with Augmentin.

## 2023-11-06 ENCOUNTER — Encounter: Payer: Self-pay | Admitting: Family Medicine

## 2023-11-09 ENCOUNTER — Encounter: Payer: Self-pay | Admitting: Nurse Practitioner

## 2023-11-09 ENCOUNTER — Ambulatory Visit (INDEPENDENT_AMBULATORY_CARE_PROVIDER_SITE_OTHER): Payer: 59 | Admitting: Nurse Practitioner

## 2023-11-09 VITALS — BP 122/82 | HR 74 | Temp 97.9°F | Ht 65.0 in | Wt 198.0 lb

## 2023-11-09 DIAGNOSIS — E8940 Asymptomatic postprocedural ovarian failure: Secondary | ICD-10-CM | POA: Insufficient documentation

## 2023-11-09 DIAGNOSIS — B9689 Other specified bacterial agents as the cause of diseases classified elsewhere: Secondary | ICD-10-CM | POA: Diagnosis not present

## 2023-11-09 DIAGNOSIS — Z01411 Encounter for gynecological examination (general) (routine) with abnormal findings: Secondary | ICD-10-CM | POA: Diagnosis not present

## 2023-11-09 DIAGNOSIS — E2839 Other primary ovarian failure: Secondary | ICD-10-CM | POA: Insufficient documentation

## 2023-11-09 DIAGNOSIS — J019 Acute sinusitis, unspecified: Secondary | ICD-10-CM | POA: Diagnosis not present

## 2023-11-09 DIAGNOSIS — Z01419 Encounter for gynecological examination (general) (routine) without abnormal findings: Secondary | ICD-10-CM

## 2023-11-09 MED ORDER — ESTRADIOL 0.075 MG/24HR TD PTTW: 1.0000 | MEDICATED_PATCH | TRANSDERMAL | 3 refills | Status: AC

## 2023-11-09 MED ORDER — PREDNISONE 20 MG PO TABS: ORAL_TABLET | ORAL | 0 refills | Status: DC

## 2023-11-09 MED ORDER — LEVOFLOXACIN 500 MG PO TABS
500.0000 mg | ORAL_TABLET | Freq: Every day | ORAL | 0 refills | Status: AC
Start: 2023-11-09 — End: 2023-11-16

## 2023-11-09 NOTE — Progress Notes (Signed)
 Subjective:    Patient ID: Alicia Ross, female    DOB: Mar 15, 1976, 48 y.o.   MRN: [id number]  HPI The patient comes in today for a wellness visit.    A review of their health history was completed.  A review of medications was also completed.  Any needed refills; would like to increase dose of estrogen patch with 90 day refills  Eating habits: watches portion sizes; well balanced overall  Regular exercise: limited lately due to illness; usually walking  Specialist pt sees on regular basis: urology  Preventative health issues were discussed.   Additional concerns: Has had persistent sinus issues.  Was started on Augmentin  about a week ago.  Before that had cold symptoms for a week and a half.  Producing very thick yellow mucus nasally and coughing up.  Describes as extremely thick and clumpy.  Occurs all day.  No fever.  Occasional cough.  No sore throat.  Ear pressure with slight pain.  Facial area headache and pressure.  Non-smoker.  Has tried Claritin-D and Nasonex with minimal relief.  Gets GYN physicals with gynecology, this includes her mammogram.  This is due in July, patient has this scheduled.  Same female sexual partner.  Has had a hysterectomy with BSO.  Currently on estradiol patch 0.05 mg twice a week.  Feels this is not enough hormones after her recent BSO.  Same female sexual partner.  Has had genetic testing for strong familial breast cancer history.  Patient has had to hold on MRI lately due to cost and meeting her deductible.  Has been trying to alternate diagnostic mammogram with MRI due to dense breasts.  Has been under more anxiety and stress lately particularly due to work.  Defers need for medication.    Review of Systems  Constitutional:  Positive for fatigue. Negative for activity change and appetite change.  HENT:  Positive for congestion, ear pain, postnasal drip and sinus pressure. Negative for sore throat.   Respiratory:  Positive for cough. Negative for chest  tightness, shortness of breath and wheezing.   Cardiovascular:  Negative for chest pain.  Gastrointestinal:  Negative for abdominal distention, abdominal pain, constipation, diarrhea, nausea and vomiting.  Genitourinary:  Negative for difficulty urinating, dysuria, frequency, pelvic pain, urgency and vaginal discharge.      11/09/2023    8:35 AM  Depression screen PHQ 2/9  Decreased Interest 0  Down, Depressed, Hopeless 1  PHQ - 2 Score 1  Altered sleeping 1  Tired, decreased energy 0  Change in appetite 0  Feeling bad or failure about yourself  1  Trouble concentrating 0  Moving slowly or fidgety/restless 0  Suicidal thoughts 0  PHQ-9 Score 3  Difficult doing work/chores Somewhat difficult      11/09/2023    8:35 AM 07/06/2023   11:48 AM 04/23/2023   11:19 AM 11/07/2022    2:19 PM  GAD 7 : Generalized Anxiety Score  Nervous, Anxious, on Edge 1 1 1 1   Control/stop worrying 0 1 0 1  Worry too much - different things 1 1 0 0  Trouble relaxing 1 0 1 1  Restless 0 0 0 1  Easily annoyed or irritable 2 0 1 0  Afraid - awful might happen 0 0 0 0  Total GAD 7 Score 5 3 3 4   Anxiety Difficulty Somewhat difficult Not difficult at all Not difficult at all Somewhat difficult   Social History   Tobacco Use   Smoking status: Never  Smokeless tobacco: Never  Vaping Use   Vaping status: Never Used  Substance Use Topics   Alcohol use: No   Drug use: Never          Objective:   Physical Exam Vitals and nursing note reviewed.  Constitutional:      General: She is not in acute distress.    Appearance: She is well-developed.  Neck:     Thyroid: No thyromegaly.     Trachea: No tracheal deviation.     Comments: Thyroid non tender to palpation. No mass or goiter noted.  Cardiovascular:     Rate and Rhythm: Normal rate and regular rhythm.     Heart sounds: Normal heart sounds. No murmur heard. Pulmonary:     Effort: Pulmonary effort is normal.     Breath sounds: Normal  breath sounds.  Abdominal:     General: There is no distension.     Palpations: Abdomen is soft.     Tenderness: There is no abdominal tenderness.  Musculoskeletal:     Cervical back: Normal range of motion and neck supple.  Lymphadenopathy:     Cervical: No cervical adenopathy.     Upper Body:     Right upper body: No supraclavicular adenopathy.     Left upper body: No supraclavicular adenopathy.  Skin:    General: Skin is warm and dry.     Findings: No rash.  Neurological:     Mental Status: She is alert and oriented to person, place, and time.  Psychiatric:        Mood and Affect: Mood normal.        Behavior: Behavior normal.        Thought Content: Thought content normal.        Judgment: Judgment normal.      The 10-year ASCVD risk score (Arnett DK, et al., 2019) is: 1.2%   Values used to calculate the score:     Age: 54 years     Sex: Female     Is Non-Hispanic African American: No     Diabetic: No     Tobacco smoker: No     Systolic Blood Pressure: 122 mmHg     Is BP treated: No     HDL Cholesterol: 48 mg/dL     Total Cholesterol: 207 mg/dL Results for orders placed or performed in visit on 08/28/23  CBC with Differential/Platelet   Collection Time: 09/25/23  8:06 AM  Result Value Ref Range   WBC 5.9 3.4 - 10.8 x10E3/uL   RBC 4.75 3.77 - 5.28 x10E6/uL   Hemoglobin 15.4 11.1 - 15.9 g/dL   Hematocrit 09.8 11.9 - 46.6 %   MCV 95 79 - 97 fL   MCH 32.4 26.6 - 33.0 pg   MCHC 34.1 31.5 - 35.7 g/dL   RDW 14.7 82.9 - 56.2 %   Platelets 205 150 - 450 x10E3/uL   Neutrophils 52 Not Estab. %   Lymphs 39 Not Estab. %   Monocytes 8 Not Estab. %   Eos 1 Not Estab. %   Basos 0 Not Estab. %   Neutrophils Absolute 3.1 1.4 - 7.0 x10E3/uL   Lymphocytes Absolute 2.3 0.7 - 3.1 x10E3/uL   Monocytes Absolute 0.5 0.1 - 0.9 x10E3/uL   EOS (ABSOLUTE) 0.1 0.0 - 0.4 x10E3/uL   Basophils Absolute 0.0 0.0 - 0.2 x10E3/uL   Immature Granulocytes 0 Not Estab. %   Immature Grans  (Abs) 0.0 0.0 - 0.1 x10E3/uL  Comprehensive metabolic  panel   Collection Time: 09/25/23  8:06 AM  Result Value Ref Range   Glucose 96 70 - 99 mg/dL   BUN 15 6 - 24 mg/dL   Creatinine, Ser 9.14 0.57 - 1.00 mg/dL   eGFR 782 >95 AO/ZHY/8.65   BUN/Creatinine Ratio 24 (H) 9 - 23   Sodium 141 134 - 144 mmol/L   Potassium 4.3 3.5 - 5.2 mmol/L   Chloride 102 96 - 106 mmol/L   CO2 21 20 - 29 mmol/L   Calcium 9.5 8.7 - 10.2 mg/dL   Total Protein 7.2 6.0 - 8.5 g/dL   Albumin 4.9 3.9 - 4.9 g/dL   Globulin, Total 2.3 1.5 - 4.5 g/dL   Bilirubin Total 0.6 0.0 - 1.2 mg/dL   Alkaline Phosphatase 75 44 - 121 IU/L   AST 19 0 - 40 IU/L   ALT 18 0 - 32 IU/L  Lipid panel   Collection Time: 09/25/23  8:06 AM  Result Value Ref Range   Cholesterol, Total 207 (H) 100 - 199 mg/dL   Triglycerides 784 0 - 149 mg/dL   HDL 48 >69 mg/dL   VLDL Cholesterol Cal 21 5 - 40 mg/dL   LDL Chol Calc (NIH) 629 (H) 0 - 99 mg/dL   Chol/HDL Ratio 4.3 0.0 - 4.4 ratio  TSH   Collection Time: 09/25/23  8:06 AM  Result Value Ref Range   TSH 2.820 0.450 - 4.500 uIU/mL  Iron, TIBC and Ferritin Panel   Collection Time: 09/25/23  8:06 AM  Result Value Ref Range   Total Iron Binding Capacity 289 250 - 450 ug/dL   UIBC 528 413 - 244 ug/dL   Iron 010 27 - 272 ug/dL   Iron Saturation 44 15 - 55 %   Ferritin 108 15 - 150 ng/mL   Labs reviewed with patient during office visit.     Assessment & Plan:   Problem List Items Addressed This Visit       Other   Hypoestrogenism   Surgical menopause   Other Visit Diagnoses       Well woman exam    -  Primary     Acute bacterial rhinosinusitis       Relevant Medications   predniSONE  (DELTASONE ) 20 MG tablet   levofloxacin  (LEVAQUIN ) 500 MG tablet      Meds ordered this encounter  Medications   predniSONE  (DELTASONE ) 20 MG tablet    Sig: Take 2 tabs (40 mg) po once a day x 5 days    Dispense:  10 tablet    Refill:  0    Supervising Provider:   Charlotta Cook A  [[id number]]   levofloxacin  (LEVAQUIN ) 500 MG tablet    Sig: Take 1 tablet (500 mg total) by mouth daily for 7 days.    Dispense:  7 tablet    Refill:  0    Supervising Provider:   Charlotta Cook A [[id number]]   estradiol (VIVELLE-DOT) 0.075 MG/24HR    Sig: Place 1 patch onto the skin 2 (two) times a week.    Dispense:  24 patch    Refill:  3    Supervising Provider:   Charlotta Cook A [[id number]]   Complete last dose of Augmentin  today.  Then switch to Levaquin  once a day for 7 days.  Also add short course of oral prednisone  due to persistent sinus pressure.  Add Mucinex as directed to help thin out mucus.  Advised patient to drink plenty of fluids. Discussed  options regarding her estrogen.  Will go up 1 step on the estrogen patch for the next few months, consider increasing to 0.1 mg if needed.  Discussed risk including breast cancer and PE/DVT.  Note patient is a non-smoker.  Feel this is our safest choice versus oral therapy due to family breast cancer history. Call back in 7 to 10 days if no improvement in her sinusitis, sooner if worse. Schedule mammogram as planned. Recommend that she contact us  in October to see if we can get her mammogram scheduled due to dense breast and breast cancer risk sometime in early December, hopefully she has met at least most of her deductible at that time.

## 2023-11-13 ENCOUNTER — Encounter: Payer: Self-pay | Admitting: Nurse Practitioner

## 2023-11-30 ENCOUNTER — Ambulatory Visit: Admitting: Physician Assistant

## 2023-11-30 ENCOUNTER — Encounter: Payer: Self-pay | Admitting: Physician Assistant

## 2023-11-30 VITALS — BP 119/83 | HR 74 | Temp 98.4°F | Ht 65.0 in | Wt 198.6 lb

## 2023-11-30 DIAGNOSIS — J329 Chronic sinusitis, unspecified: Secondary | ICD-10-CM | POA: Insufficient documentation

## 2023-11-30 DIAGNOSIS — J324 Chronic pansinusitis: Secondary | ICD-10-CM | POA: Insufficient documentation

## 2023-11-30 NOTE — Progress Notes (Signed)
 Acute Office Visit  Subjective:     Patient ID: Alicia Ross, female    DOB: 07-31-1975, 48 y.o.   MRN: [id number]   Patient presents today with complaints of continued sinus pressure and congestion. She has been seen previously in our office on 4/22 and 4/28 for similar symptoms. Previously treated with Augmentin  and Levaquin . Since then she reports minimal improvement in symptoms. Associated symptoms include cough and ear pressure. She denies fevers, shortness of breath, or chest pain. She has been taking Claritin and Mucinex for symptoms as well as a daily nasal spray. She reports she recently started using a nasal lavage with moderate improvement of symptoms.       Review of Systems  Constitutional:  Negative for fever and malaise/fatigue.  HENT:  Positive for congestion, ear pain and sinus pain. Negative for ear discharge and sore throat.   Eyes:  Negative for blurred vision and double vision.  Respiratory:  Positive for cough. Negative for shortness of breath.   Cardiovascular:  Negative for chest pain.  Neurological:  Negative for headaches.        Objective:     BP 119/83   Pulse 74   Temp 98.4 F (36.9 C)   Ht 5\' 5"  (1.651 m)   Wt 198 lb 9.6 oz (90.1 kg)   SpO2 97%   BMI 33.05 kg/m   Physical Exam Vitals reviewed.  Constitutional:      General: She is not in acute distress.    Appearance: Normal appearance.  HENT:     Right Ear: Tympanic membrane normal.     Left Ear: Tympanic membrane normal.     Nose: Mucosal edema, congestion and rhinorrhea present.     Mouth/Throat:     Mouth: Mucous membranes are moist.     Pharynx: Oropharynx is clear.  Eyes:     Extraocular Movements: Extraocular movements intact.     Conjunctiva/sclera: Conjunctivae normal.  Cardiovascular:     Rate and Rhythm: Normal rate and regular rhythm.     Heart sounds: Normal heart sounds. No murmur heard. Pulmonary:     Effort: Pulmonary effort is normal.     Breath sounds: Normal  breath sounds. No stridor. No wheezing, rhonchi or rales.  Musculoskeletal:     Cervical back: Normal range of motion and neck supple. No tenderness.  Lymphadenopathy:     Cervical: No cervical adenopathy.  Skin:    General: Skin is warm and dry.  Neurological:     General: No focal deficit present.     Mental Status: She is alert and oriented to person, place, and time.  Psychiatric:        Mood and Affect: Mood normal.        Behavior: Behavior normal.     No results found for any visits on 11/30/23.      Assessment & Plan:  Chronic sinusitis, unspecified location Assessment & Plan: Patient presents today with 2 months of sinus pressure and congestion with failure of improvement with Augmentin  and Levaquin . Patient stable today, no signs of acute distress. Overall benign physical exam. With persistent symptoms despite 2 antibiotic trials, will place referral to ENT for further workup. Advised continued use of Claritin, nasal spray, and Mucinex. Patient offered cough syrup but defers at this time. Patient to follow up for worsening symptoms, fevers, chest pain, or shortness of breath.   Orders: -     Ambulatory referral to ENT    Return if symptoms worsen or  fail to improve.  Jearlean Mince Yari Szeliga, PA-C

## 2023-11-30 NOTE — Assessment & Plan Note (Signed)
 Patient presents today with 2 months of sinus pressure and congestion with failure of improvement with Augmentin  and Levaquin . Patient stable today, no signs of acute distress. Overall benign physical exam. With persistent symptoms despite 2 antibiotic trials, will place referral to ENT for further workup. Advised continued use of Claritin, nasal spray, and Mucinex. Patient offered cough syrup but defers at this time. Patient to follow up for worsening symptoms, fevers, chest pain, or shortness of breath.

## 2024-01-28 ENCOUNTER — Ambulatory Visit (INDEPENDENT_AMBULATORY_CARE_PROVIDER_SITE_OTHER): Admitting: Otolaryngology

## 2024-01-28 ENCOUNTER — Encounter (INDEPENDENT_AMBULATORY_CARE_PROVIDER_SITE_OTHER): Payer: Self-pay | Admitting: Otolaryngology

## 2024-01-28 VITALS — BP 126/82 | HR 76

## 2024-01-28 DIAGNOSIS — R0981 Nasal congestion: Secondary | ICD-10-CM

## 2024-01-28 DIAGNOSIS — J310 Chronic rhinitis: Secondary | ICD-10-CM

## 2024-01-28 DIAGNOSIS — J343 Hypertrophy of nasal turbinates: Secondary | ICD-10-CM

## 2024-01-28 DIAGNOSIS — J342 Deviated nasal septum: Secondary | ICD-10-CM

## 2024-01-28 DIAGNOSIS — J324 Chronic pansinusitis: Secondary | ICD-10-CM

## 2024-01-30 DIAGNOSIS — J343 Hypertrophy of nasal turbinates: Secondary | ICD-10-CM | POA: Insufficient documentation

## 2024-01-30 DIAGNOSIS — J342 Deviated nasal septum: Secondary | ICD-10-CM | POA: Insufficient documentation

## 2024-01-30 NOTE — Progress Notes (Signed)
 CC: Recurrent sinusitis, chronic nasal congestion  HPI:  Alicia Ross is a 48 y.o. female who presents today complaining of frequent recurrent sinusitis and chronic nasal congestion.  Her sinusitis symptoms include facial pain and pressure, increasing nasal obstruction, and nasal drainage.  Her last infection was in April 2025.  She was treated with multiple courses of antibiotics and steroids.  It took her 6 weeks to recover from the infection.  The patient has a history of environmental allergies.  She is currently on Claritin and Nasacort daily.  She has no previous ENT surgery.  Past Medical History:  Diagnosis Date   Chronic headaches    Family history of breast cancer    Family history of melanoma    Family history of pancreatic cancer    Family history of uterine cancer    GERD (gastroesophageal reflux disease)    Intermittent palpitations    06-22-2023  per pt told her pcp about having intermittant palpitations since high school very occasionally (noted a pcp office note in epic 04-23-2023) but pt stated past 4 months episodes where heart bear a little faster has to catch her breath, mostly happens at rest,  this happens dialy 6-12 times,  pt stated was referred to cardiology has appt w/ [facility] ,  pt denies sob w/ any activity   Menorrhagia    Pelvic pain    PONV (postoperative nausea and vomiting)    Sinusitis 07/12/2023   Sleep related teeth grinding    uses mouth guard   Tingling of both upper extremities    06-22-2023  per pt when she lays on her back sleeping wakes up, her upper her tingling but goes away quickly when rolls off back   Uterine fibroid     Past Surgical History:  Procedure Laterality Date   CESAREAN SECTION  2008   INCISIONAL HERNIA REPAIR N/A 06/27/2020   Procedure: HERNIA REPAIR INCISIONAL;OPEN WITH MESH;  Surgeon: Kallie Manuelita BROCKS, MD;  Location: [facility];  Service: General;  Laterality: N/A;   KNEE ARTHROSCOPY Left 2006   LAPAROSCOPIC LYSIS OF  ADHESIONS N/A 06/29/2023   Procedure: LAPAROSCOPIC LYSIS OF ADHESIONS;  Surgeon: Laurence Slater PARAS, MD;  Location: [facility];  Service: Gynecology;  Laterality: N/A;   LAPAROSCOPIC TUBAL LIGATION Bilateral 08/25/2014   Procedure: LAPAROSCOPIC TUBAL LIGATION with Filshie Clips;  Surgeon: Truman Corona, MD;  Location: [facility];  Service: Gynecology;  Laterality: Bilateral;   TOTAL LAPAROSCOPIC HYSTERECTOMY WITH SALPINGECTOMY Bilateral 06/29/2023   Procedure: TOTAL LAPAROSCOPIC HYSTERECTOMY WITH BILATERAL SALPINGO OOPHORECTOMY;  Surgeon: Laurence Slater PARAS, MD;  Location: [facility];  Service: Gynecology;  Laterality: Bilateral;   WISDOM TOOTH EXTRACTION  1995   WRIST GANGLION EXCISION Left 1994  & 2004   in 2004  also removed spurs    Family History  Problem Relation Age of Onset   Melanoma Mother 83   Uterine cancer Mother 57   Hemochromatosis Father    Pancreatic cancer Maternal Uncle 52   Breast cancer Paternal Aunt 28   Arthritis Maternal Grandmother    Hemochromatosis Paternal Grandmother    Breast cancer Paternal Grandmother 61   Breast cancer Other 9   Breast cancer Other    Hemochromatosis Cousin        pat first cousin    Social History:  reports that she has never smoked. She has never used smokeless tobacco. She reports that she does not drink alcohol and does not use drugs.  Allergies:  Allergies  Allergen Reactions   Hibiclens  [Chlorhexidine  Gluconate] Hives, Shortness Of Breath, Itching and Swelling   Codeine Other (See Comments)    GI UPSET   Colace [Docusate] Hives    Prior to Admission medications   Medication Sig Start Date End Date Taking? Authorizing Provider  acetaminophen  (TYLENOL ) 500 MG tablet Take 1 tablet (500 mg total) by mouth every 4 (four) hours as needed for moderate pain (pain score 4-6) or mild pain (pain score 1-3). 06/29/23  Yes Laurence Slater PARAS, MD  carboxymethylcellulose (REFRESH PLUS) 0.5 % SOLN Place  1 drop into both eyes 3 (three) times daily as needed.   Yes [provider]  cholecalciferol (VITAMIN D3) 25 MCG (1000 UNIT) tablet Take 1,000 Units by mouth daily before lunch.   Yes [provider]  estradiol  (VIVELLE -DOT) 0.075 MG/24HR Place 1 patch onto the skin 2 (two) times a week. 11/09/23  Yes Mauro Elveria BROCKS, NP  loratadine (CLARITIN) 10 MG tablet Take 10 mg by mouth daily.   Yes [provider]  MegaRed Omega-3 Krill Oil 350 MG CAPS Take 1 capsule by mouth daily after lunch.   Yes [provider]  Multiple Vitamins-Minerals (MULTIVITAMIN WOMEN PO) Take 1 tablet by mouth daily at 6 (six) AM.   Yes [provider]  OVER THE COUNTER MEDICATION Take 1 capsule by mouth daily before lunch. Shaklee GLA complex- supplement- vit E and Linoleic acid   Yes [provider]  amoxicillin -clavulanate (AUGMENTIN ) 875-125 MG tablet Take 1 tablet by mouth 2 (two) times daily. Patient not taking: Reported on 01/28/2024 11/03/23   Cook, Jayce G, DO  predniSONE  (DELTASONE ) 20 MG tablet Take 2 tabs (40 mg) po once a day x 5 days Patient not taking: Reported on 01/28/2024 11/09/23   Mauro Elveria BROCKS, NP    Blood pressure 126/82, pulse 76, SpO2 95%. Exam: General: Communicates without difficulty, well nourished, no acute distress. Head: Normocephalic, no evidence injury, no tenderness, facial buttresses intact without stepoff. Face/sinus: No tenderness to palpation and percussion. Facial movement is normal and symmetric. Eyes: PERRL, EOMI. No scleral icterus, conjunctivae clear. Neuro: CN II exam reveals vision grossly intact.  No nystagmus at any point of gaze. Ears: Auricles well formed without lesions.  Ear canals are intact without mass or lesion.  No erythema or edema is appreciated.  The TMs are intact without fluid. Nose: External evaluation reveals normal support and skin without lesions.  Dorsum is intact.  Anterior rhinoscopy reveals congested mucosa  over anterior aspect of inferior turbinates and intact septum.  No purulence noted. Oral:  Oral cavity and oropharynx are intact, symmetric, without erythema or edema.  Mucosa is moist without lesions. Neck: Full range of motion without pain.  There is no significant lymphadenopathy.  No masses palpable.  Thyroid bed within normal limits to palpation.  Parotid glands and submandibular glands equal bilaterally without mass.  Trachea is midline. Neuro:  CN 2-12 grossly intact.   Procedure:  Flexible Nasal Endoscopy: Description: Risks, benefits, and alternatives of flexible endoscopy were explained to the patient.  Specific mention was made of the risk of throat numbness with difficulty swallowing, possible bleeding from the nose and mouth, and pain from the procedure.  The patient gave oral consent to proceed.  The flexible scope was inserted into the right nasal cavity.  Endoscopy of the interior nasal cavity, superior, inferior, and middle meatus was performed. The sphenoid-ethmoid recess was examined. Edematous mucosa was noted.  No polyp, mass,  or lesion was appreciated. Nasal septal deviation noted. Olfactory cleft was clear.  Nasopharynx was clear.  Turbinates were hypertrophied but without mass.  The procedure was repeated on the contralateral side with similar findings.  The patient tolerated the procedure well.   Assessment: 1.  Chronic rhinitis with nasal mucosal congestion, nasal septal deviation, and bilateral inferior turbinate hypertrophy. 2.  History of recurrent rhinosinusitis.  No purulent drainage or polyposis is noted on today's nasal endoscopy examination.  Plan: 1.  The physical exam and nasal endoscopy findings are reviewed with the patient. 2.  Continue with Nasacort nasal spray daily.  The importance of consistent daily use is discussed. 3.  The pathophysiology of recurrent sinusitis is discussed. 4.  The patient will return for reevaluation in 3 months, sooner if needed.  Cloris Flippo W  Tynisa Vohs 01/30/2024, 4:59 PM

## 2024-04-29 ENCOUNTER — Encounter (INDEPENDENT_AMBULATORY_CARE_PROVIDER_SITE_OTHER): Payer: Self-pay | Admitting: Otolaryngology

## 2024-04-29 ENCOUNTER — Ambulatory Visit (INDEPENDENT_AMBULATORY_CARE_PROVIDER_SITE_OTHER): Admitting: Otolaryngology

## 2024-04-29 VITALS — BP 131/83 | HR 70 | Temp 97.7°F | Ht 65.0 in | Wt 195.0 lb

## 2024-04-29 DIAGNOSIS — J343 Hypertrophy of nasal turbinates: Secondary | ICD-10-CM | POA: Diagnosis not present

## 2024-04-29 DIAGNOSIS — J342 Deviated nasal septum: Secondary | ICD-10-CM

## 2024-04-29 DIAGNOSIS — J310 Chronic rhinitis: Secondary | ICD-10-CM | POA: Insufficient documentation

## 2024-04-29 NOTE — Progress Notes (Signed)
 Patient ID: Alicia Ross, female   DOB: 17-Oct-1975, 48 y.o.   MRN: [id number]  Follow-up: Recurrent rhinosinusitis, chronic nasal obstruction  HPI: The patient is a 48 year old female who returns today for her follow-up evaluation.  The patient has a history of recurrent rhinosinusitis and chronic nasal obstruction.  At her last visit in July 2025, she was noted to have nasal mucosal congestion, nasal septal deviation, and bilateral inferior turbinate hypertrophy.  The patient was treated with Nasacort nasal spray and nasal saline irrigation.  The patient returns today no significant difficulty over the past 3 months.  She did not have any recent sinus infection.  She reports only occasional left facial pressure and mild nasal congestion.  Exam: General: Communicates without difficulty, well nourished, no acute distress. Head: Normocephalic, no evidence injury, no tenderness, facial buttresses intact without stepoff. Face/sinus: No tenderness to palpation and percussion. Facial movement is normal and symmetric. Eyes: PERRL, EOMI. No scleral icterus, conjunctivae clear. Neuro: CN II exam reveals vision grossly intact.  No nystagmus at any point of gaze. Ears: Auricles well formed without lesions.  Ear canals are intact without mass or lesion.  No erythema or edema is appreciated.  The TMs are intact without fluid. Nose: External evaluation reveals normal support and skin without lesions.  Dorsum is intact.  Anterior rhinoscopy reveals congested mucosa over anterior aspect of inferior turbinates and intact septum.  No purulence noted. Oral:  Oral cavity and oropharynx are intact, symmetric, without erythema or edema.  Mucosa is moist without lesions. Neck: Full range of motion without pain.  There is no significant lymphadenopathy.  No masses palpable.  Thyroid bed within normal limits to palpation.  Parotid glands and submandibular glands equal bilaterally without mass.  Trachea is midline. Neuro:  CN 2-12  grossly intact.   Assessment: 1.  Chronic rhinitis with nasal mucosal congestion, nasal septal deviation, and bilateral inferior turbinate hypertrophy.  The severity of her nasal congestion has decreased. 2.  History of recurrent rhinosinusitis.  No purulent drainage or polyposis is noted on today's examination.   Plan: 1.  The physical exam findings are reviewed with the patient. 2.  Continue with Nasacort nasal spray daily.  The importance of consistent daily use is discussed.  Nasal saline irrigation as needed. 3.  The patient will return for reevaluation in 6 months, sooner if needed.

## 2024-06-06 ENCOUNTER — Encounter: Payer: Self-pay | Admitting: Nurse Practitioner

## 2024-06-20 ENCOUNTER — Ambulatory Visit: Admitting: Nurse Practitioner

## 2024-06-21 ENCOUNTER — Ambulatory Visit: Admitting: Nurse Practitioner

## 2024-06-21 VITALS — BP 124/79 | HR 58 | Temp 97.9°F | Ht 65.0 in | Wt 198.0 lb

## 2024-06-21 DIAGNOSIS — S161XXA Strain of muscle, fascia and tendon at neck level, initial encounter: Secondary | ICD-10-CM

## 2024-06-21 DIAGNOSIS — E8940 Asymptomatic postprocedural ovarian failure: Secondary | ICD-10-CM

## 2024-06-24 ENCOUNTER — Encounter: Payer: Self-pay | Admitting: Nurse Practitioner

## 2024-06-24 NOTE — Progress Notes (Signed)
 Subjective:    Patient ID: Alicia Ross, female    DOB: [birth year], 48 y.o.   MRN: [id number]  HPI Discussed the use of AI scribe software for clinical note transcription with the patient, who gave verbal consent to proceed.  History of Present Illness Alicia Ross is a 48 year old female who presents with a lump on her neck and concerns about emotional changes post-hysterectomy.  She has noticed a lump on the side of her neck for the past three months, which is more noticeable at night. There are no associated fevers, unexplained weight loss, or other systemic symptoms. Tenderness with palpation and certain movements. No history of injury. Has applied heat to the area.   She underwent a hysterectomy with removal of her tubes, ovaries, and uterus almost a year ago. Since then, she has experienced significant emotional changes, including increased crying, anxiety, and heightened emotional sensitivity. She is on an estrogen patch, which was adjusted in late April or early May, and she feels it has helped balance her emotions. Occasional hot flashes occur but are not bothersome.  She has a family history of breast cancer and alternates between breast MRI and mammogram due to her high risk and dense breast tissue. She recalls having an MRI two years ago and a mammogram in the summer but is unsure if the results were sent to her current provider.  She experiences significant stress at work, which she acknowledges as detrimental to her health. She reports snoring and waking up with occasional headaches, which her husband has noticed. She feels tired during the day and describes herself as a very light sleeper. She has lost a significant amount of weight since her highest post-pregnancy weight of 235 pounds and is currently maintaining her weight below 200 pounds.    Review of Systems  Constitutional:  Positive for fatigue.  Respiratory:  Negative for cough, chest tightness, shortness of breath and  wheezing.   Cardiovascular:  Negative for chest pain.  Musculoskeletal:  Positive for neck pain.  Neurological:  Positive for headaches.      06/21/2024   11:23 AM  Depression screen PHQ 2/9  Decreased Interest 0  Down, Depressed, Hopeless 1  PHQ - 2 Score 1  Altered sleeping 1  Tired, decreased energy 1  Change in appetite 0  Feeling bad or failure about yourself  0  Trouble concentrating 0  Moving slowly or fidgety/restless 0  Suicidal thoughts 0  PHQ-9 Score 3  Difficult doing work/chores Not difficult at all      06/21/2024   11:23 AM 11/30/2023    8:44 AM 11/09/2023    8:35 AM 07/06/2023   11:48 AM  GAD 7 : Generalized Anxiety Score  Nervous, Anxious, on Edge 0 1 1 1   Control/stop worrying 0 0 0 1  Worry too much - different things 0 1 1 1   Trouble relaxing 1 1 1  0  Restless 0 0 0 0  Easily annoyed or irritable 0 0 2 0  Afraid - awful might happen 0 0 0 0  Total GAD 7 Score 1 3 5 3   Anxiety Difficulty Not difficult at all Somewhat difficult Somewhat difficult Not difficult at all         Objective:   Physical Exam Vitals and nursing note reviewed.  Constitutional:      General: She is not in acute distress. Neck:     Comments: Distinct tenderness and tight muscles noted along the left lateral neck area. Has  her head tilted slightly to the side during visit. Pain noted in the left neck area with ROM. No lymphadenopathy or masses.  Cardiovascular:     Rate and Rhythm: Normal rate and regular rhythm.  Pulmonary:     Effort: Pulmonary effort is normal.     Breath sounds: Normal breath sounds.  Musculoskeletal:     Cervical back: Normal range of motion. Tenderness present.  Lymphadenopathy:     Cervical: No cervical adenopathy.  Neurological:     Mental Status: She is alert and oriented to person, place, and time.  Psychiatric:        Mood and Affect: Mood normal.        Behavior: Behavior normal.        Thought Content: Thought content normal.     Today's Vitals   06/21/24 1121  BP: 124/79  Pulse: (!) 58  Temp: 97.9 F (36.6 C)  SpO2: 99%  Weight: 198 lb (89.8 kg)  Height: 5' 5 (1.651 m)   Body mass index is 32.95 kg/m.         Assessment & Plan:  1. Surgical menopause (Primary) Surgical menopause post-hysterectomy with ovaries removed. On estradiol  patch 0.075 mg/24hr. Emotional lability and stress. Occasional mild hot flashes. Discussed estradiol  dose increase but maintained current dose due to stressors and breast cancer risk. - Continue estradiol  patch 0.075 mg/24hr. - Monitor symptoms and consider dose adjustment if needed.  2. Strain of neck muscle, initial encounter Chronic tension likely stress-related, with tightness in sternocleidomastoid and lateral muscles. Possible contribution from sleep position and potential obstructive sleep apnea. - Recommended massage therapy, ice or heat application, and stretching exercises. - Suggested using a neck pillow with heat and vibration. - Advised on proper pillow support during sleep. - Consider chiropractic adjustments if needed. - Discussed potential obstructive sleep apnea and will consider home-based sleep test in the future.  3. Breast cancer screening, high risk patient Is due for repeat MRI once a year. See radiology report 08/15/2022.  - MR BREAST BILATERAL W WO CONTRAST INC CAD  Return for follow up in spring for physical and labs.  I personally spent a total of 30 minutes in the care of the patient today including getting/reviewing separately obtained history, performing a medically appropriate exam/evaluation, counseling and educating, placing orders, referring and communicating with other health care professionals, and documenting clinical information in the EHR.

## 2024-07-01 ENCOUNTER — Ambulatory Visit (HOSPITAL_COMMUNITY)
Admission: RE | Admit: 2024-07-01 | Discharge: 2024-07-01 | Disposition: A | Discharge status: Home / Self Care | Source: Ambulatory Visit | Attending: Nurse Practitioner | Admitting: Nurse Practitioner

## 2024-07-01 ENCOUNTER — Encounter (HOSPITAL_COMMUNITY): Payer: Self-pay | Admitting: Radiology

## 2024-07-01 DIAGNOSIS — Z1239 Encounter for other screening for malignant neoplasm of breast: Secondary | ICD-10-CM | POA: Diagnosis present

## 2024-07-01 MED ORDER — GADOBUTROL 1 MMOL/ML IV SOLN
9.0000 mL | Freq: Once | INTRAVENOUS | Status: AC | PRN
  Administered 2024-07-01: 9 mL via INTRAVENOUS

## 2024-07-04 ENCOUNTER — Ambulatory Visit: Admitting: Nurse Practitioner

## 2024-07-06 ENCOUNTER — Telehealth: Payer: Self-pay | Admitting: *Deleted

## 2024-07-06 NOTE — Telephone Encounter (Signed)
 Copied from CRM #[id number]. Topic: Clinical - Request for Lab/Test Order >> Jul 05, 2024  3:55 PM Kendralyn S wrote: Reason for CRM: calling to schedule the biopsy for the breast, available after christmas. [phone]

## 2024-07-11 ENCOUNTER — Other Ambulatory Visit: Payer: Self-pay | Admitting: Nurse Practitioner

## 2024-07-11 DIAGNOSIS — R928 Other abnormal and inconclusive findings on diagnostic imaging of breast: Secondary | ICD-10-CM

## 2024-07-11 NOTE — Telephone Encounter (Unsigned)
 Copied from CRM #[id number]. Topic: Clinical - Request for Lab/Test Order >> Jul 08, 2024  8:11 AM Franky GRADE wrote: Reason for CRM: Patient is calling to have orders for a breast MRI sent to the breast center.

## 2024-07-18 ENCOUNTER — Ambulatory Visit
Admission: RE | Admit: 2024-07-18 | Discharge: 2024-07-18 | Disposition: A | Discharge status: Home / Self Care | Source: Ambulatory Visit | Attending: Nurse Practitioner | Admitting: Nurse Practitioner

## 2024-07-18 ENCOUNTER — Inpatient Hospital Stay
Admission: RE | Admit: 2024-07-18 | Discharge: 2024-07-18 | Disposition: A | Source: Ambulatory Visit | Attending: Nurse Practitioner | Admitting: Nurse Practitioner

## 2024-07-18 DIAGNOSIS — R928 Other abnormal and inconclusive findings on diagnostic imaging of breast: Secondary | ICD-10-CM

## 2024-07-18 MED ORDER — GADOPICLENOL 0.5 MMOL/ML IV SOLN
9.0000 mL | Freq: Once | INTRAVENOUS | Status: AC | PRN
  Administered 2024-07-18: 9 mL via INTRAVENOUS

## 2024-07-19 ENCOUNTER — Encounter: Payer: Self-pay | Admitting: Nurse Practitioner

## 2024-07-19 LAB — SURGICAL PATHOLOGY

## 2024-07-22 NOTE — Telephone Encounter (Signed)
 Alicia Elveria BROCKS, NP     07/22/24  9:54 AM I think we can because radiology recommends this. The issue with the other patients is that the radiologist states repeat screening mammogram in one year. We will see :) It any problems, will send to high risk breast center. Thanks

## 2024-10-28 ENCOUNTER — Ambulatory Visit (INDEPENDENT_AMBULATORY_CARE_PROVIDER_SITE_OTHER): Admitting: Otolaryngology
